# Patient Record
Sex: Female | Born: 1960 | Race: White | Hispanic: No | Smoking: Never smoker
Health system: Southern US, Community
[De-identification: ages and names within clinical notes are randomized; demographics above are authoritative.]

## PROBLEM LIST (undated history)

## (undated) DIAGNOSIS — F32A Depression, unspecified: Secondary | ICD-10-CM

## (undated) DIAGNOSIS — I10 Essential (primary) hypertension: Secondary | ICD-10-CM

---

## 2019-12-25 DIAGNOSIS — G935 Compression of brain: Secondary | ICD-10-CM | POA: Diagnosis not present

## 2019-12-25 DIAGNOSIS — I10 Essential (primary) hypertension: Secondary | ICD-10-CM | POA: Diagnosis not present

## 2019-12-25 DIAGNOSIS — R202 Paresthesia of skin: Secondary | ICD-10-CM | POA: Diagnosis present

## 2019-12-25 DIAGNOSIS — N390 Urinary tract infection, site not specified: Secondary | ICD-10-CM | POA: Insufficient documentation

## 2019-12-25 DIAGNOSIS — H532 Diplopia: Secondary | ICD-10-CM | POA: Insufficient documentation

## 2019-12-25 LAB — COMPREHENSIVE METABOLIC PANEL
ALT: 18 U/L (ref 0–44)
AST: 18 U/L (ref 15–41)
Albumin: 4.2 g/dL (ref 3.5–5.0)
Alkaline Phosphatase: 69 U/L (ref 38–126)
Anion gap: 9 (ref 5–15)
BUN: 18 mg/dL (ref 6–20)
CO2: 24 mmol/L (ref 22–32)
Calcium: 9 mg/dL (ref 8.9–10.3)
Chloride: 104 mmol/L (ref 98–111)
Creatinine, Ser: 0.81 mg/dL (ref 0.44–1.00)
GFR, Estimated: >60 mL/min (ref >60)
Glucose, Bld: 109 mg/dL — ABNORMAL HIGH (ref 70–99)
Potassium: 4 mmol/L (ref 3.5–5.1)
Sodium: 137 mmol/L (ref 135–145)
Total Bilirubin: 0.6 mg/dL (ref 0.3–1.2)
Total Protein: 7.5 g/dL (ref 6.5–8.1)

## 2019-12-25 LAB — CBC
HCT: 42.8 % (ref 36.0–46.0)
Hemoglobin: 13.7 g/dL (ref 12.0–15.0)
MCH: 27.1 pg (ref 26.0–34.0)
MCHC: 32 g/dL (ref 30.0–36.0)
MCV: 84.6 fL (ref 80.0–100.0)
Platelets: 277 10*3/uL (ref 150–400)
RBC: 5.06 MIL/uL (ref 3.87–5.11)
RDW: 14.1 % (ref 11.5–15.5)
WBC: 13.3 10*3/uL — ABNORMAL HIGH (ref 4.0–10.5)
nRBC: 0 % (ref 0.0–0.2)

## 2019-12-25 LAB — TROPONIN I (HIGH SENSITIVITY)
Troponin I (High Sensitivity): 4 ng/L (ref <18)
Troponin I (High Sensitivity): 5 ng/L (ref <18)

## 2019-12-25 NOTE — ED Triage Notes (Signed)
PT has had numbness to left arm since march, states was told due to htn. Has seen cardio, neuro for the same and recently has had dizziness with symptoms. States has also had double vision intermittently since last week which is a new symptom.

## 2019-12-26 ENCOUNTER — Emergency Department: Payer: BLUE CROSS/BLUE SHIELD

## 2019-12-26 ENCOUNTER — Emergency Department
Admission: EM | Admit: 2019-12-26 | Discharge: 2019-12-26 | Disposition: A | Discharge status: Home / Self Care | Payer: BLUE CROSS/BLUE SHIELD | Attending: Emergency Medicine | Admitting: Emergency Medicine

## 2019-12-26 DIAGNOSIS — R200 Anesthesia of skin: Secondary | ICD-10-CM

## 2019-12-26 DIAGNOSIS — G935 Compression of brain: Secondary | ICD-10-CM

## 2019-12-26 DIAGNOSIS — H532 Diplopia: Secondary | ICD-10-CM

## 2019-12-26 DIAGNOSIS — N390 Urinary tract infection, site not specified: Secondary | ICD-10-CM

## 2019-12-26 LAB — URINALYSIS, COMPLETE (UACMP) WITH MICROSCOPIC
Bilirubin Urine: NEGATIVE
Glucose, UA: NEGATIVE mg/dL
Hgb urine dipstick: NEGATIVE
Ketones, ur: NEGATIVE mg/dL
Nitrite: NEGATIVE
Protein, ur: NEGATIVE mg/dL
Specific Gravity, Urine: 1.021 (ref 1.005–1.030)
WBC, UA: >50 WBC/hpf — ABNORMAL HIGH (ref 0–5)
pH: 5 (ref 5.0–8.0)

## 2019-12-26 LAB — SEDIMENTATION RATE: Sed Rate: 11 mm/hr (ref 0–30)

## 2019-12-26 IMAGING — MR MR MRA HEAD W/O CM
1 series · 18 of 48 positions shown · non-contrast
Comparison: None available.

CLINICAL DATA: Initial evaluation for acute dizziness. Several
month history of left arm numbness.

EXAM:
MRI HEAD WITHOUT CONTRAST
MRA HEAD WITHOUT CONTRAST
TECHNIQUE: Multiplanar, multiecho pulse sequences of the brain and surrounding
structures were obtained without intravenous contrast. Angiographic
images of the head were obtained using MRA technique without
contrast.

[Series 1: TOF · axial · 0.5mm · 0.41mm/px · z∈[-107,-10]mm · 18 of 205 slices shown]
[im 1/205]
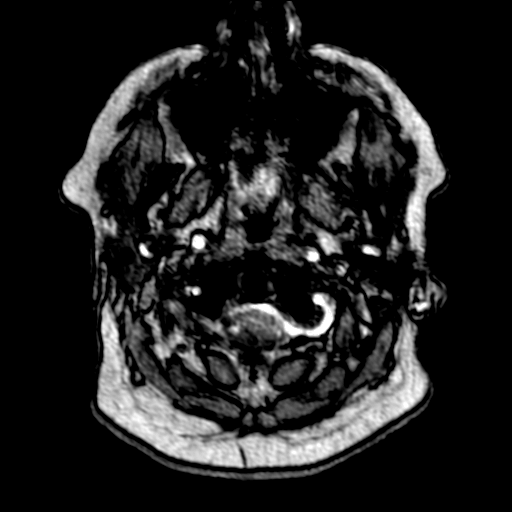
[im 5/205]
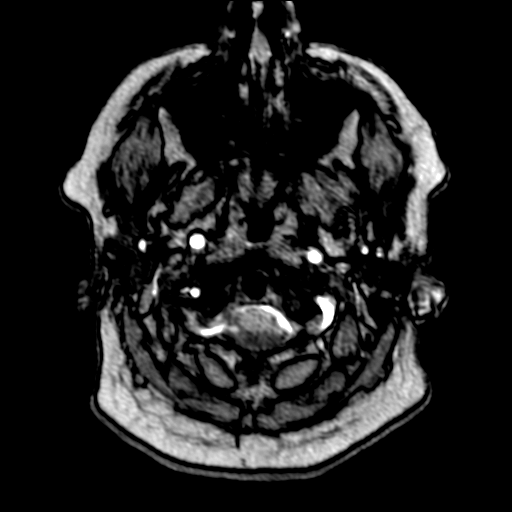
[im 9/205]
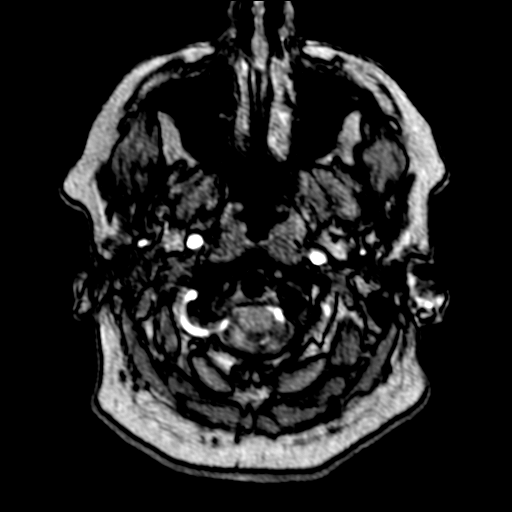
[im 14/205]
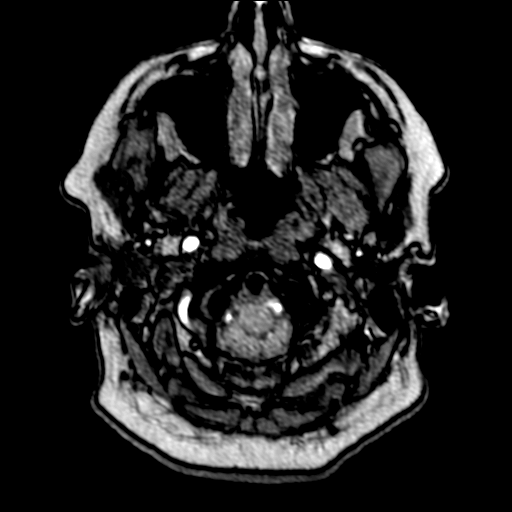
[im 18/205]
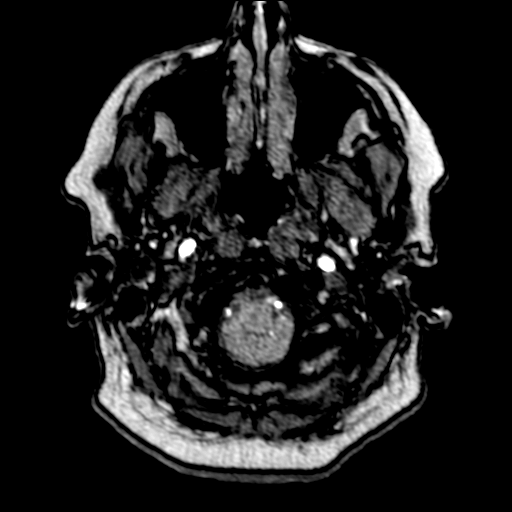
[im 22/205]
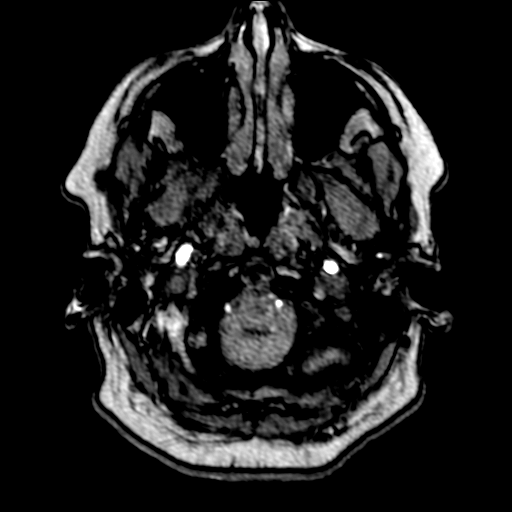
[im 27/205]
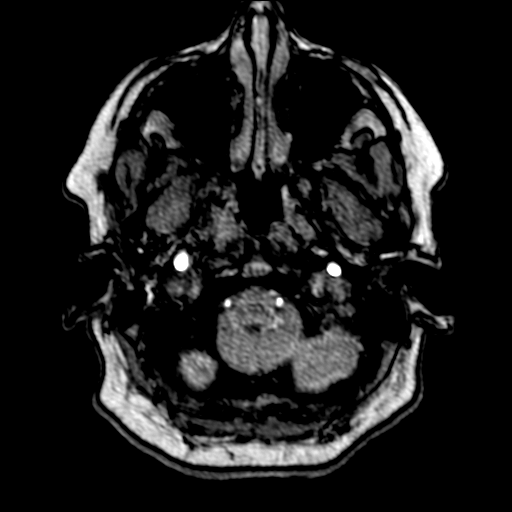
[im 31/205]
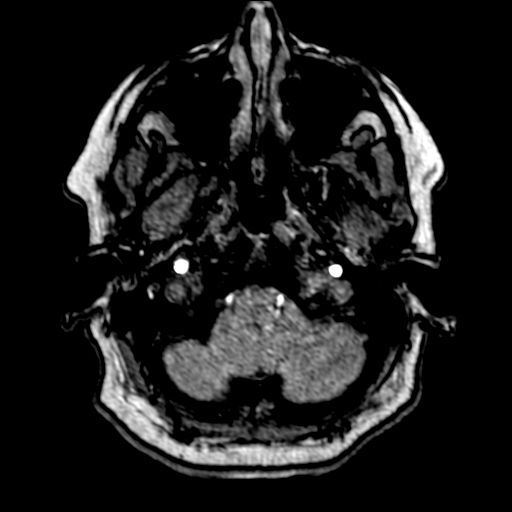
[im 35/205]
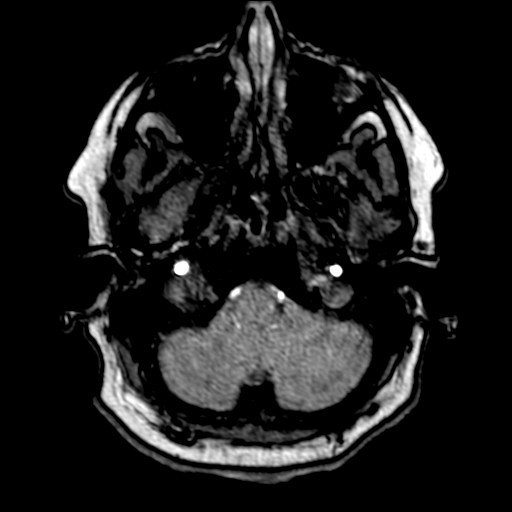
[im 40/205]
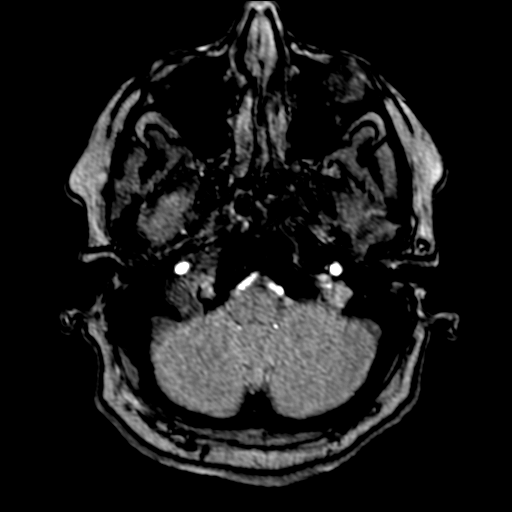
[im 66/205]
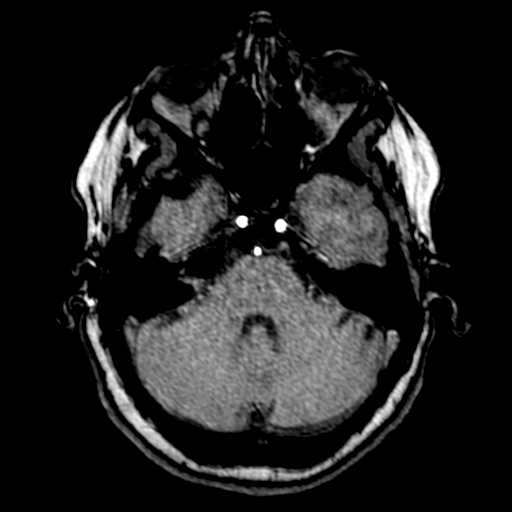
[im 92/205]
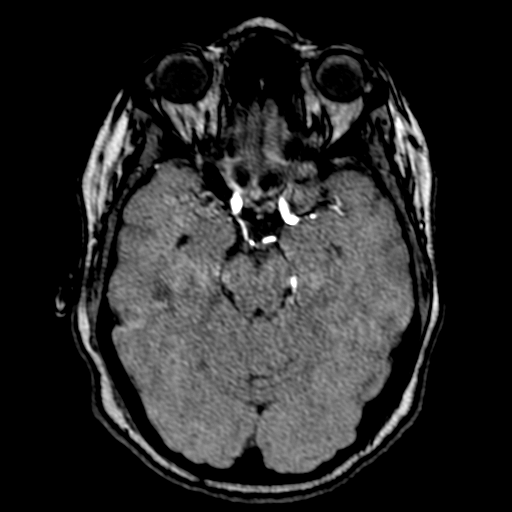
[im 105/205]
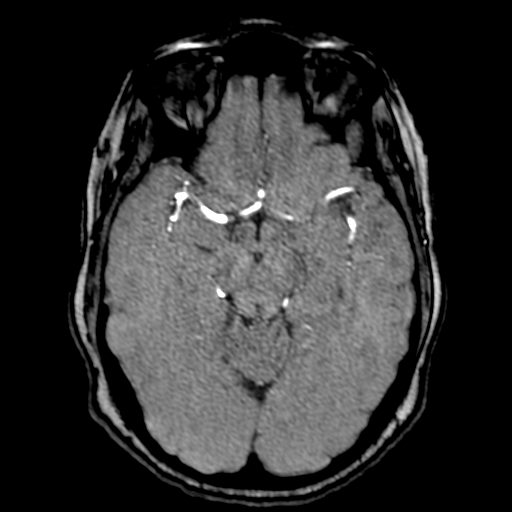
[im 118/205]
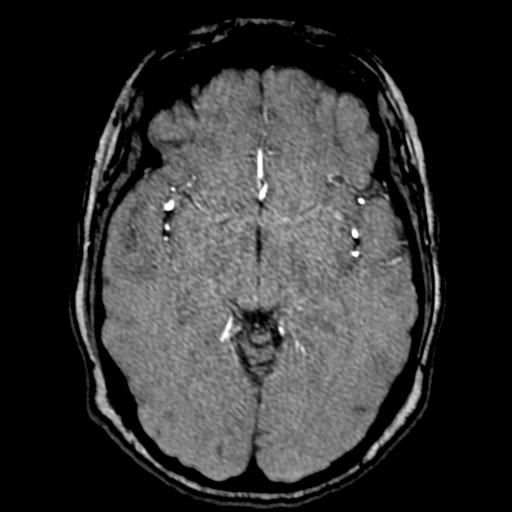
[im 144/205]
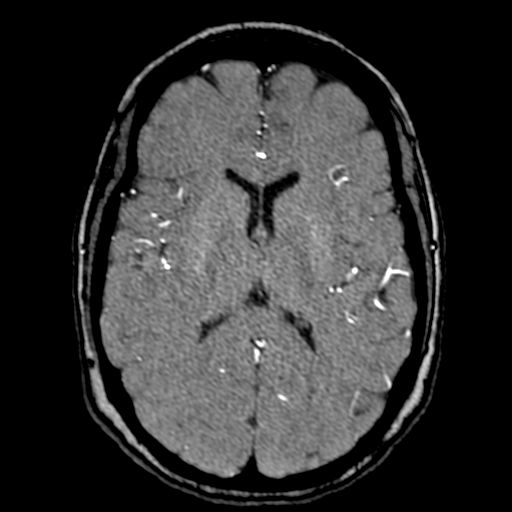
[im 170/205]
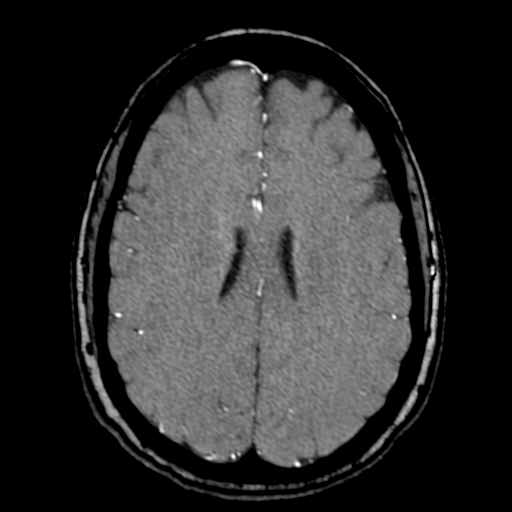
[im 174/205]
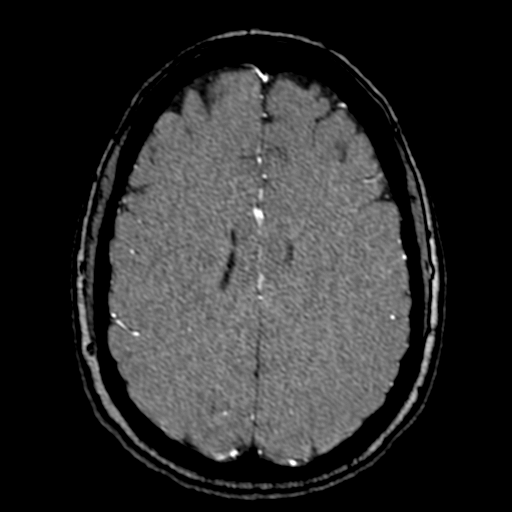
[im 196/205]
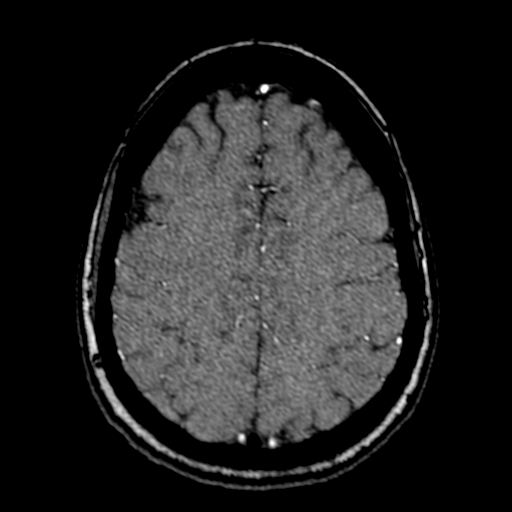

[18 of 48 positions shown; findings below may reference images not displayed]

FINDINGS: MRI HEAD FINDINGS

Brain: Cerebral volume within normal limits for age. Few scattered
subcentimeter foci of T2/FLAIR hyperintensity noted within the deep
white matter both cerebral hemispheres, most prominent of which
measures 7 mm at the left frontal corona radiata (series 15, image
32). Findings are nonspecific, but most likely small vessel related,
felt to be within normal limits for age. No other focal parenchymal
signal abnormality.

No abnormal foci of restricted diffusion to suggest acute or
subacute ischemia. Gray-white matter differentiation maintained. No
encephalomalacia to suggest chronic cortical infarction. No acute
intracranial hemorrhage. Two small foci of susceptibility artifact
noted at the posterior left parieto-occipital white matter (series
3, image 33), suggesting small chronic micro hemorrhages, of
doubtful significance in isolation.

No mass lesion, midline shift or mass effect. No hydrocephalus or
extra-axial fluid collection. Pituitary gland and suprasellar region
within normal limits. Midline structures intact.

Vascular: Major intracranial vascular flow voids are maintained.

Skull and upper cervical spine: Borderline Chiari 1 malformation
with the cerebellar tonsils extending approximately 6-7 mm through
the foramen magnum. Craniocervical junction otherwise unremarkable.
Bone marrow signal intensity within normal limits. No scalp soft
tissue abnormality.

Sinuses/Orbits: Globes and orbital soft tissues within normal
limits. Paranasal sinuses are largely clear. No mastoid effusion.
Inner ear structures grossly normal.

Other: None.

MRA HEAD FINDINGS

ANTERIOR CIRCULATION:

Examination mildly degraded by motion.

Visualized distal cervical segments of the internal carotid arteries
are patent with symmetric antegrade flow. Petrous, cavernous, and
supraclinoid ICAs patent without significant stenosis or other
abnormality. A1 segments widely patent. Normal anterior
communicating artery complex. Anterior cerebral arteries patent to
their distal aspects without stenosis. No M1 stenosis or occlusion.
Normal MCA bifurcations. Distal MCA branches well perfused and
symmetric.

POSTERIOR CIRCULATION:

Both vertebral arteries patent to the vertebrobasilar junction
without stenosis. Both PICAs grossly patent. Basilar patent to its
distal aspect without stenosis. Superior cerebral arteries patent
bilaterally. PCA supplied via the basilar as well as small bilateral
posterior communicating arteries. Both PCAs well perfused to their
distal aspects without stenosis.

No intracranial aneurysm or other vascular abnormality.
IMPRESSION: MRI HEAD IMPRESSION:

1. No acute intracranial abnormality.
2. Borderline Chiari 1 malformation with the cerebellar tonsils
extending approximately 6-7 mm through the foramen magnum.
3. Otherwise essentially normal brain MRI for age.

MRA HEAD IMPRESSION:

Negative intracranial MRA. No large vessel occlusion,
hemodynamically significant stenosis, or other vascular abnormality.

## 2019-12-26 IMAGING — MR MR HEAD W/O CM
10 of 11 series · 37 of 48 positions shown · non-contrast
Comparison: None available.

CLINICAL DATA: Initial evaluation for acute dizziness. Several
month history of left arm numbness.

EXAM:
MRI HEAD WITHOUT CONTRAST
MRA HEAD WITHOUT CONTRAST
TECHNIQUE: Multiplanar, multiecho pulse sequences of the brain and surrounding
structures were obtained without intravenous contrast. Angiographic
images of the head were obtained using MRA technique without
contrast.

[Series 5: ax dwi_tracew · axial · 3.0mm · 0.60mm/px · z∈[-118,+37]mm · 4 of 48 slices shown]
[im 1/48]
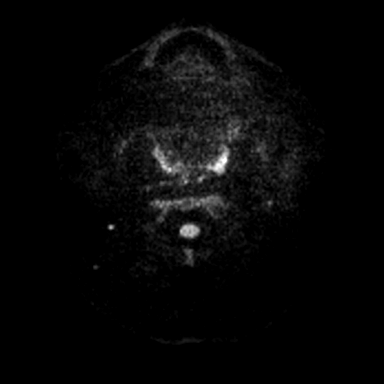
[im 16/48]
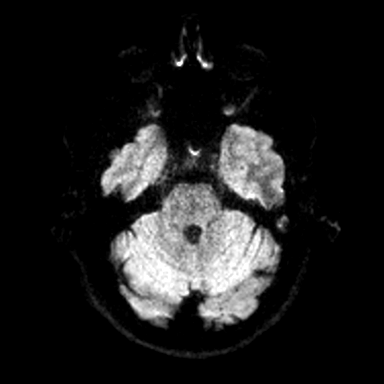
[im 32/48]
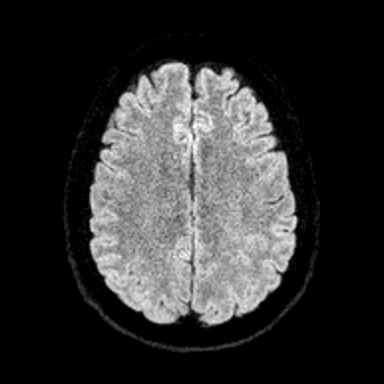
[im 48/48]
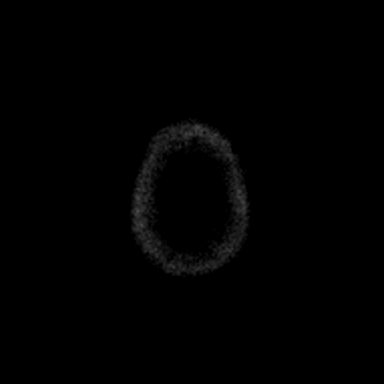

[Series 6: ax dwi_adc · axial · 3.0mm · 0.60mm/px · z∈[-118,+37]mm · 4 of 48 slices shown]
[im 1/48]
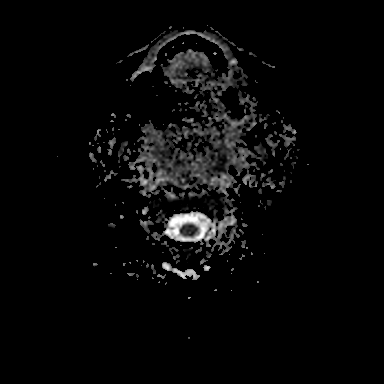
[im 16/48]
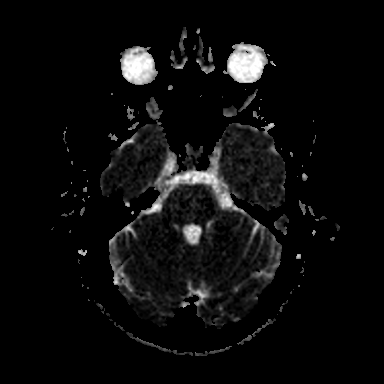
[im 32/48]
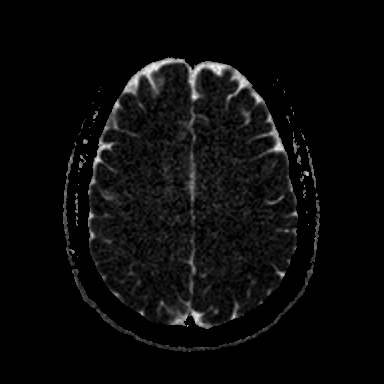
[im 48/48]
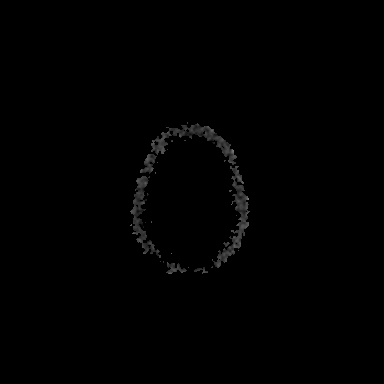

[Series 7: cor dwi_tracew · coronal · 5.0mm · 0.60mm/px · 3 of 40 slices shown]
[im 1/40]
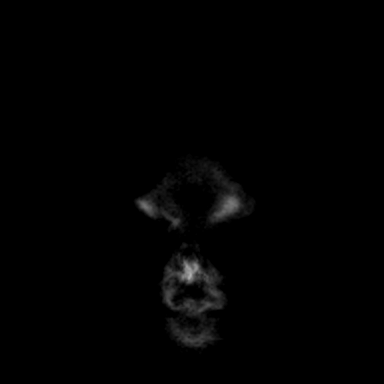
[im 20/40]
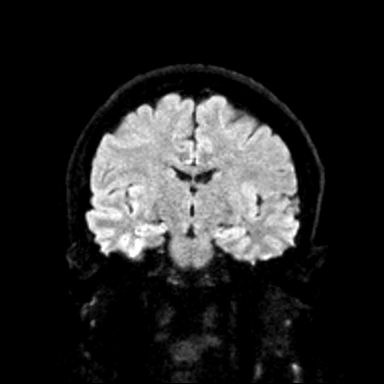
[im 40/40]
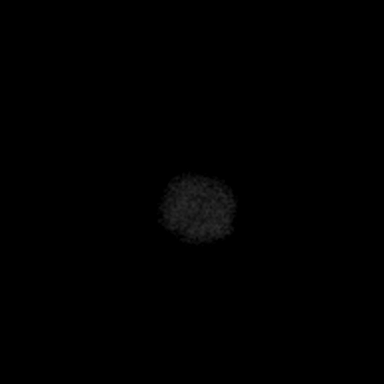

[Series 8: cor dwi_adc · coronal · 5.0mm · 0.60mm/px · 3 of 39 slices shown]
[im 1/39]
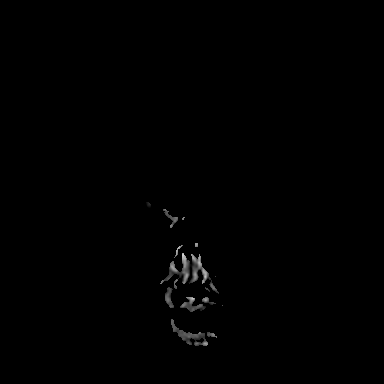
[im 20/39]
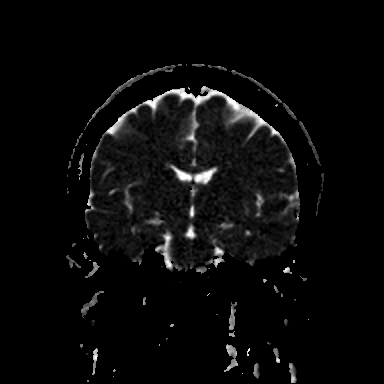
[im 39/39]
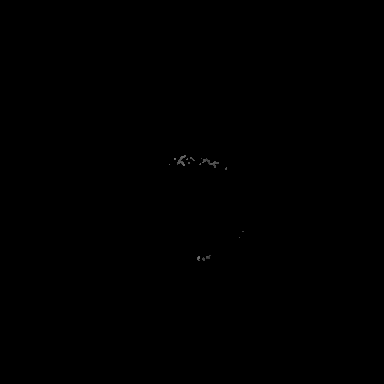

[Series 9: T1 · sagittal · 5.0mm · 0.62mm/px · 2 of 23 slices shown (1 of 2)]
[im 1/23]
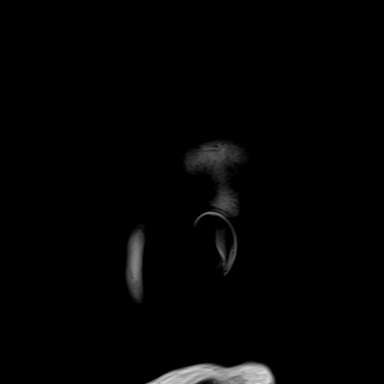
[im 23/23]
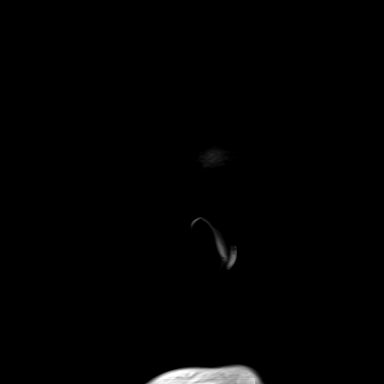

[Series 10: T2 · axial · 5.0mm · 0.53mm/px · z∈[-118,+38]mm · 2 of 27 slices shown (1 of 2)]
[im 1/27]
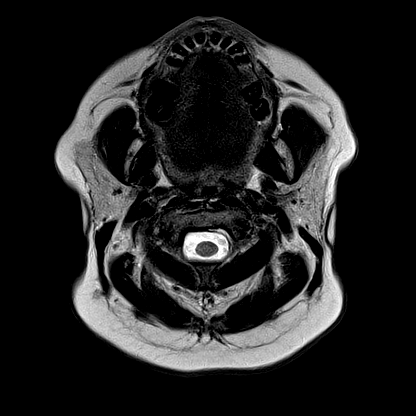
[im 27/27]
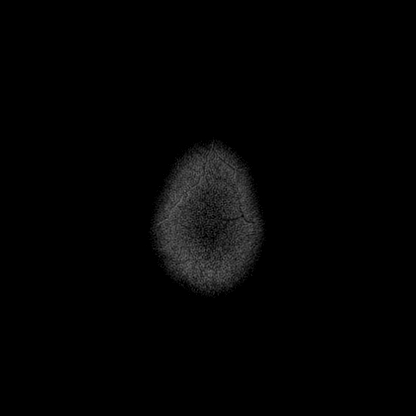

[Series 12: pha_images · axial · 3.0mm · 0.90mm/px · z∈[-126,+45]mm · 5 of 58 slices shown]
[im 1/58]
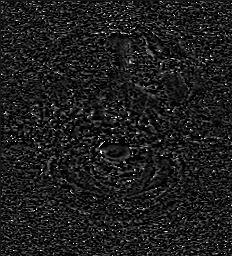
[im 15/58]
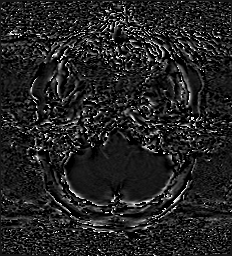
[im 29/58]
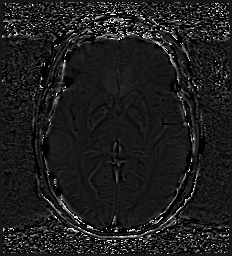
[im 43/58]
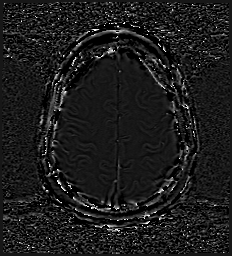
[im 58/58]
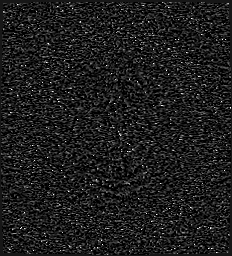

[Series 15: FLAIR · axial · 3.0mm · 0.53mm/px · z∈[-121,+41]mm · 4 of 55 slices shown]
[im 1/55]
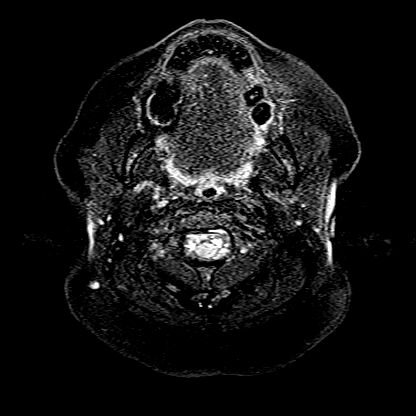
[im 19/55]
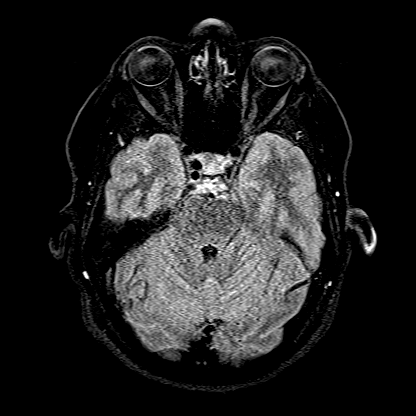
[im 37/55]
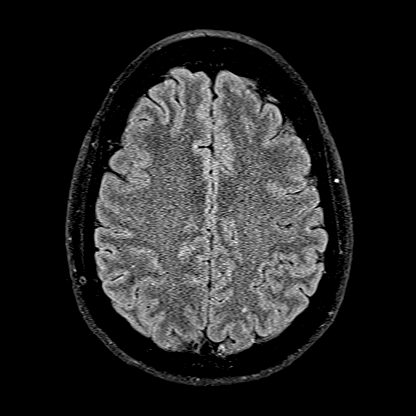
[im 55/55]
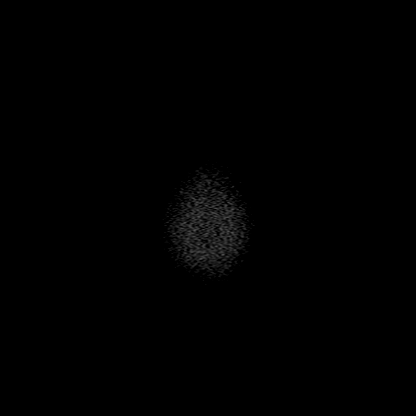

[Series 16: T1 · axial · 1.0mm · 0.98mm/px · z∈[-128,+47]mm · 8 of 176 slices shown (2 of 2)]
[im 1/176]
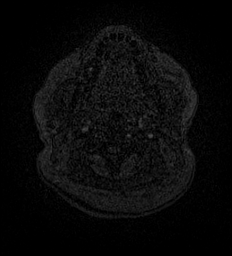
[im 27/176]
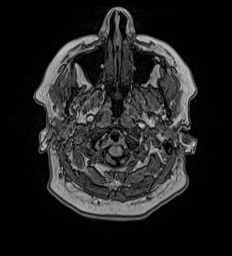
[im 54/176]
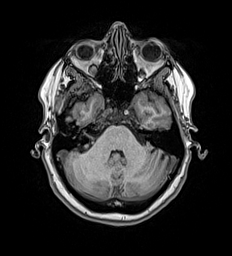
[im 81/176]
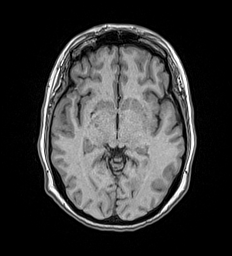
[im 95/176]
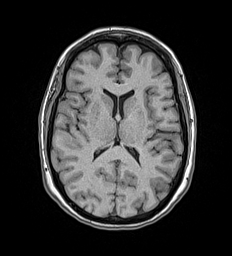
[im 122/176]
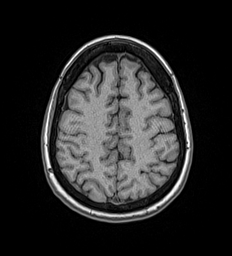
[im 149/176]
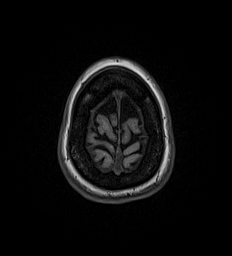
[im 176/176]
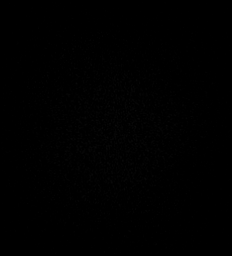

[Series 17: T2 · coronal · 5.0mm · 0.57mm/px · 2 of 29 slices shown (2 of 2)]
[im 1/29]
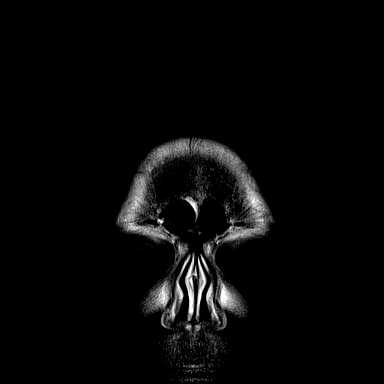
[im 29/29]
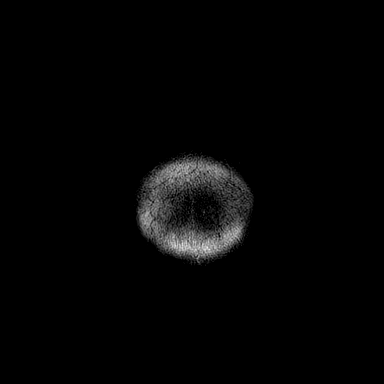

[37 of 48 positions shown; findings below may reference images not displayed]

FINDINGS: MRI HEAD FINDINGS

Brain: Cerebral volume within normal limits for age. Few scattered
subcentimeter foci of T2/FLAIR hyperintensity noted within the deep
white matter both cerebral hemispheres, most prominent of which
measures 7 mm at the left frontal corona radiata (series 15, image
32). Findings are nonspecific, but most likely small vessel related,
felt to be within normal limits for age. No other focal parenchymal
signal abnormality.

No abnormal foci of restricted diffusion to suggest acute or
subacute ischemia. Gray-white matter differentiation maintained. No
encephalomalacia to suggest chronic cortical infarction. No acute
intracranial hemorrhage. Two small foci of susceptibility artifact
noted at the posterior left parieto-occipital white matter (series
3, image 33), suggesting small chronic micro hemorrhages, of
doubtful significance in isolation.

No mass lesion, midline shift or mass effect. No hydrocephalus or
extra-axial fluid collection. Pituitary gland and suprasellar region
within normal limits. Midline structures intact.

Vascular: Major intracranial vascular flow voids are maintained.

Skull and upper cervical spine: Borderline Chiari 1 malformation
with the cerebellar tonsils extending approximately 6-7 mm through
the foramen magnum. Craniocervical junction otherwise unremarkable.
Bone marrow signal intensity within normal limits. No scalp soft
tissue abnormality.

Sinuses/Orbits: Globes and orbital soft tissues within normal
limits. Paranasal sinuses are largely clear. No mastoid effusion.
Inner ear structures grossly normal.

Other: None.

MRA HEAD FINDINGS

ANTERIOR CIRCULATION:

Examination mildly degraded by motion.

Visualized distal cervical segments of the internal carotid arteries
are patent with symmetric antegrade flow. Petrous, cavernous, and
supraclinoid ICAs patent without significant stenosis or other
abnormality. A1 segments widely patent. Normal anterior
communicating artery complex. Anterior cerebral arteries patent to
their distal aspects without stenosis. No M1 stenosis or occlusion.
Normal MCA bifurcations. Distal MCA branches well perfused and
symmetric.

POSTERIOR CIRCULATION:

Both vertebral arteries patent to the vertebrobasilar junction
without stenosis. Both PICAs grossly patent. Basilar patent to its
distal aspect without stenosis. Superior cerebral arteries patent
bilaterally. PCA supplied via the basilar as well as small bilateral
posterior communicating arteries. Both PCAs well perfused to their
distal aspects without stenosis.

No intracranial aneurysm or other vascular abnormality.
IMPRESSION: MRI HEAD IMPRESSION:

1. No acute intracranial abnormality.
2. Borderline Chiari 1 malformation with the cerebellar tonsils
extending approximately 6-7 mm through the foramen magnum.
3. Otherwise essentially normal brain MRI for age.

MRA HEAD IMPRESSION:

Negative intracranial MRA. No large vessel occlusion,
hemodynamically significant stenosis, or other vascular abnormality.

## 2019-12-26 MED ORDER — CEPHALEXIN 500 MG PO CAPS
500.0000 mg | ORAL_CAPSULE | Freq: Once | ORAL | Status: AC
  Administered 2019-12-26: 500 mg via ORAL
  Filled 2019-12-26: qty 1

## 2019-12-26 MED ORDER — LORAZEPAM 2 MG/ML IJ SOLN
2.0000 mg | Freq: Once | INTRAMUSCULAR | Status: AC
  Administered 2019-12-26: 2 mg via INTRAMUSCULAR
  Filled 2019-12-26: qty 1

## 2019-12-26 MED ORDER — CEPHALEXIN 500 MG PO CAPS: 500.0000 mg | ORAL_CAPSULE | Freq: Three times a day (TID) | ORAL | 0 refills | Status: DC

## 2019-12-26 NOTE — ED Notes (Signed)
Visual Acuity Screening   BIL eyes 20/20 Left eye 20/30 Right eye 20/25

## 2019-12-26 NOTE — Discharge Instructions (Addendum)
Continue your medicines as directed by your doctor. Take antibiotic as prescribed for UTI. Please follow-up with the ophthalmologist (eye doctor) and neurologist as soon as possible. Return to the ER for worsening symptoms, persistent vomiting, difficulty breathing or other concerns.

## 2019-12-26 NOTE — ED Provider Notes (Signed)
[facility] Emergency Department Provider Note   ____________________________________________   First MD Initiated Contact with Patient 12/26/19 0134     (approximate)  I have reviewed the triage vital signs and the nursing notes.   HISTORY  Chief Complaint Numbness    HPI Samantha Phillips is a 59 y.o. female who presents to the ED from home with multiple medical complaints.  Patient recently moved from [place] where in March 2021 she started experiencing intermittent numbness to her left arm, then bilateral arms.  She was seen by cardiology who evaluated her with Holter monitoring, cardiac cath, CT chest.  Thought her symptoms were related to hypertension and started on Coreg 12.5 mg twice daily and atorvastatin.  She was seen at several different hospitals with CT imaging with and without contrast of her head and neck.  She also had MRI with and without contrast of her brain.  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  Throughout the course of her extensive work-up, patient was never referred to neurology.  States her symptoms have been better since the summer but since 11/28/2019 she has started to experience intermittent numbness again to mainly her left arm but also her right arm.  These episodes last approximately to 30 minutes and her blood pressures taken throughout these episodes are generally 170s/90s.  She had one episode last week as she was looking on the computer monitor she thought she saw double vision on one specific part of the monitor but nowhere else.  This was improved after she applied eyedrops.  During these episodes patient denies confusion, slurred speech, facial droop, extremity weakness.  She has established a new PCP in this area and has been referred to neurology, currently awaiting appointment.  Denies recent fever, cough, chest pain, shortness of breath, abdominal  pain, nausea or vomiting.  Denies recent travel or trauma.   Past medical history Hypertension  There are no problems to display for this patient.   Prior to Admission medications   Medication Sig Start Date End Date Taking? Authorizing Provider  cephALEXin (KEFLEX) 500 MG capsule Take 1 capsule (500 mg total) by mouth 3 (three) times daily. 12/26/19   Irean Hong, MD    Allergies Sulfa antibiotics and Penicillins  No family history on file.  Social History Social History   Tobacco Use  . Smoking status: Not on file  Substance Use Topics  . Alcohol use: Not on file  . Drug use: Not on file  Non-smoker  Review of Systems  Constitutional: No fever/chills Eyes: No visual changes. ENT: No sore throat. Cardiovascular: Denies chest pain. Respiratory: Denies shortness of breath. Gastrointestinal: No abdominal pain.  No nausea, no vomiting.  No diarrhea.  No constipation. Genitourinary: Negative for dysuria. Musculoskeletal: Negative for back pain. Skin: Negative for rash. Neurological: Negative for headaches, focal weakness.  Positive for numbness.   ____________________________________________   PHYSICAL EXAM:  VITAL SIGNS: ED Triage Vitals  Enc Vitals Group     BP 12/25/19 1948 (!) 149/85     Pulse Rate 12/25/19 1948 64     Resp 12/25/19 1948 18     Temp 12/25/19 1948 98.7 F (37.1 C)     Temp Source 12/25/19 1948 Oral     SpO2 12/25/19 1948 97 %     Weight 12/25/19 1948 257 lb (116.6 kg)     Height 12/25/19 1948 5\' 3"  (1.6 m)  Head Circumference --      Peak Flow --      Pain Score 12/25/19 1952 0     Pain Loc --      Pain Edu? --      Excl. in GC? --     Constitutional: Alert and oriented. Well appearing and in no acute distress. Eyes: Conjunctivae are normal. PERRL. EOMI. Head: Atraumatic. Nose: No congestion/rhinnorhea. Mouth/Throat: Mucous membranes are moist.   Neck: No stridor.  Supple neck without meningismus.  No carotid  bruits. Cardiovascular: Normal rate, regular rhythm. Grossly normal heart sounds.  Good peripheral circulation. Respiratory: Normal respiratory effort.  No retractions. Lungs CTAB. Gastrointestinal: Soft and nontender. No distention. No abdominal bruits. No CVA tenderness. Musculoskeletal: No lower extremity tenderness nor edema.  No joint effusions. Neurologic: Alert and oriented x3.  CN II-XII grossly intact.  Normal speech and language. No gross focal neurologic deficits are appreciated. No gait instability. Skin:  Skin is warm, dry and intact. No rash noted.  No petechiae. Psychiatric: Mood and affect are normal. Speech and behavior are normal.  ____________________________________________   LABS (all labs ordered are listed, but only abnormal results are displayed)  Labs Reviewed  CBC - Abnormal; Notable for the following components:      Result Value   WBC 13.3 (*)    All other components within normal limits  COMPREHENSIVE METABOLIC PANEL - Abnormal; Notable for the following components:   Glucose, Bld 109 (*)    All other components within normal limits  URINALYSIS, COMPLETE (UACMP) WITH MICROSCOPIC - Abnormal; Notable for the following components:   Color, Urine YELLOW (*)    APPearance CLOUDY (*)    Leukocytes,Ua LARGE (*)    WBC, UA >50 (*)    Bacteria, UA RARE (*)    All other components within normal limits  URINE CULTURE  SEDIMENTATION RATE  TROPONIN I (HIGH SENSITIVITY)  TROPONIN I (HIGH SENSITIVITY)   ____________________________________________  EKG  ED ECG REPORT I, Brit Wernette J, the attending physician, personally viewed and interpreted this ECG.   Date: 12/26/2019  EKG Time: 1949  Rate: 67  Rhythm: normal EKG, normal sinus rhythm  Axis: Normal  Intervals:none  ST&T Change: Nonspecific  ____________________________________________  RADIOLOGY I, Dailin Sosnowski J, personally viewed and evaluated these images (plain radiographs) as part of my medical  decision making, as well as reviewing the written report by the radiologist.  ED MD interpretation: No acute intracranial abnormality, Chiari I malformation, negative MRA  Official radiology report(s): MR ANGIO HEAD WO CONTRAST  Result Date: 12/26/2019 CLINICAL DATA:  Initial evaluation for acute dizziness. Several month history of left arm numbness. EXAM: MRI HEAD WITHOUT CONTRAST MRA HEAD WITHOUT CONTRAST TECHNIQUE: Multiplanar, multiecho pulse sequences of the brain and surrounding structures were obtained without intravenous contrast. Angiographic images of the head were obtained using MRA technique without contrast. COMPARISON:  None available. FINDINGS: MRI HEAD FINDINGS Brain: Cerebral volume within normal limits for age. Few scattered subcentimeter foci of T2/FLAIR hyperintensity noted within the deep white matter both cerebral hemispheres, most prominent of which measures 7 mm at the left frontal corona radiata (series 15, image 32). Findings are nonspecific, but most likely small vessel related, felt to be within normal limits for age. No other focal parenchymal signal abnormality. No abnormal foci of restricted diffusion to suggest acute or subacute ischemia. Gray-white matter differentiation maintained. No encephalomalacia to suggest chronic cortical infarction. No acute intracranial hemorrhage. Two small foci of susceptibility artifact noted at the posterior left  parieto-occipital white matter (series 3, image 33), suggesting small chronic micro hemorrhages, of doubtful significance in isolation. No mass lesion, midline shift or mass effect. No hydrocephalus or extra-axial fluid collection. Pituitary gland and suprasellar region within normal limits. Midline structures intact. Vascular: Major intracranial vascular flow voids are maintained. 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  Craniocervical junction otherwise unremarkable. Bone marrow signal intensity within normal limits. No scalp soft tissue abnormality. Sinuses/Orbits: Globes and orbital soft tissues within normal limits. Paranasal sinuses are largely clear. No mastoid effusion. Inner ear structures grossly normal. Other: None. MRA HEAD FINDINGS ANTERIOR CIRCULATION: Examination mildly degraded by motion. Visualized distal cervical segments of the internal carotid arteries are patent with symmetric antegrade flow. Petrous, cavernous, and supraclinoid ICAs patent without significant stenosis or other abnormality. A1 segments widely patent. Normal anterior communicating artery complex. Anterior cerebral arteries patent to their distal aspects without stenosis. No M1 stenosis or occlusion. Normal MCA bifurcations. Distal MCA branches well perfused and symmetric. POSTERIOR CIRCULATION: Both vertebral arteries patent to the vertebrobasilar junction without stenosis. Both PICAs grossly patent. Basilar patent to its distal aspect without stenosis. Superior cerebral arteries patent bilaterally. PCA supplied via the basilar as well as small bilateral posterior communicating arteries. Both PCAs well perfused to their distal aspects without stenosis. No intracranial aneurysm or other vascular abnormality. IMPRESSION: MRI HEAD IMPRESSION: 1. No acute intracranial abnormality. 2. 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 3. Otherwise essentially normal brain MRI for age. MRA HEAD IMPRESSION: Negative intracranial MRA. No large vessel occlusion, hemodynamically significant stenosis, or other vascular abnormality. Electronically Signed   By: Rise Mu M.D.   On: 12/26/2019 04:29   MR BRAIN WO CONTRAST  Result Date: 12/26/2019 CLINICAL DATA:  Initial evaluation for acute dizziness. Several month history of left arm numbness. EXAM: MRI HEAD WITHOUT CONTRAST  MRA HEAD WITHOUT CONTRAST TECHNIQUE: Multiplanar, multiecho pulse sequences of the brain and surrounding structures were obtained without intravenous contrast. Angiographic images of the head were obtained using MRA technique without contrast. COMPARISON:  None available. FINDINGS: MRI HEAD FINDINGS Brain: Cerebral volume within normal limits for age. Few scattered subcentimeter foci of T2/FLAIR hyperintensity noted within the deep white matter both cerebral hemispheres, most prominent of which measures 7 mm at the left frontal corona radiata (series 15, image 32). Findings are nonspecific, but most likely small vessel related, felt to be within normal limits for age. No other focal parenchymal signal abnormality. No abnormal foci of restricted diffusion to suggest acute or subacute ischemia. Gray-white matter differentiation maintained. No encephalomalacia to suggest chronic cortical infarction. No acute intracranial hemorrhage. Two small foci of susceptibility artifact noted at the posterior left parieto-occipital white matter (series 3, image 33), suggesting small chronic micro hemorrhages, of doubtful significance in isolation. No mass lesion, midline shift or mass effect. No hydrocephalus or extra-axial fluid collection. Pituitary gland and suprasellar region within normal limits. Midline structures intact. Vascular: Major intracranial vascular flow voids are maintained. 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 Craniocervical junction otherwise unremarkable. Bone marrow signal intensity within normal limits. No scalp soft tissue abnormality. Sinuses/Orbits: Globes and orbital soft tissues within normal limits. Paranasal sinuses are largely clear. No mastoid effusion. Inner ear structures grossly normal. Other: None. MRA HEAD FINDINGS ANTERIOR CIRCULATION: Examination mildly degraded by  motion.  Visualized distal cervical segments of the internal carotid arteries are patent with symmetric antegrade flow. Petrous, cavernous, and supraclinoid ICAs patent without significant stenosis or other abnormality. A1 segments widely patent. Normal anterior communicating artery complex. Anterior cerebral arteries patent to their distal aspects without stenosis. No M1 stenosis or occlusion. Normal MCA bifurcations. Distal MCA branches well perfused and symmetric. POSTERIOR CIRCULATION: Both vertebral arteries patent to the vertebrobasilar junction without stenosis. Both PICAs grossly patent. Basilar patent to its distal aspect without stenosis. Superior cerebral arteries patent bilaterally. PCA supplied via the basilar as well as small bilateral posterior communicating arteries. Both PCAs well perfused to their distal aspects without stenosis. No intracranial aneurysm or other vascular abnormality. IMPRESSION: MRI HEAD IMPRESSION: 1. No acute intracranial abnormality. 2. 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 3. Otherwise essentially normal brain MRI for age. MRA HEAD IMPRESSION: Negative intracranial MRA. No large vessel occlusion, hemodynamically significant stenosis, or other vascular abnormality. Electronically Signed   By: Rise Mu M.D.   On: 12/26/2019 04:29    ____________________________________________   PROCEDURES  Procedure(s) performed (including Critical Care):  Procedures   ____________________________________________   INITIAL IMPRESSION / ASSESSMENT AND PLAN / ED COURSE  As part of my medical decision making, I reviewed the following data within the electronic MEDICAL RECORD NUMBER Nursing notes reviewed and incorporated, Labs reviewed, EKG interpreted, Old chart reviewed, Radiograph reviewed and Notes from prior ED visits     60 year old female with ongoing issues with her mid left arm numbness,  dizziness and vision issues.  Differential diagnosis includes but is not limited to CVA/TIA, ACS, metabolic, infectious, vasculitis, etc.  Laboratory results demonstrate mild leukocytosis.  Patient is afebrile with no focal neurological deficits.  Does not complain of dizziness or numbness currently.  Unfortunately her records from [place] are not readily available in Epic.  The above information is taken from the patient herself.  Given the recurrence and now increased frequency of patient's episodes of dizziness and left arm numbness with possible vision changes, will obtain MRI/MRA brain, add sed rate.   Clinical Course as of Dec 26 607  Tue Dec 26, 2019  6468 Updated patient on negative MRI results other than mild Chiari malformation. Will administer Keflex for UTI, and urine culture. Will provide contact information for multiple specialists. Patient on medical hold as she drove herself and received Ativan for MRI.   [JS]  0441 [facility] noted   [JS]  0606 Patient will be discharged at 8 AM after 4-hour med hold.  She will be discharged on Keflex and has contact information for multiple specialists for follow-up.  Strict return precautions given.  Patient verbalizes understanding and agrees with plan of care.   [JS]    Clinical Course User Index [JS] Irean Hong, MD     ____________________________________________   FINAL CLINICAL IMPRESSION(S) / ED DIAGNOSES  Final diagnoses:  Numbness  Urinary tract infection without hematuria, site unspecified  Chiari malformation type I [facility])  Diplopia     ED Discharge Orders         Ordered    cephALEXin (KEFLEX) 500 MG capsule  3 times daily        12/26/19 0441          *Please note:  Keisa Blow was evaluated in Emergency Department on 12/26/2019 for the symptoms described in the history of present illness. She was evaluated in the context of the global COVID-19  pandemic, which necessitated consideration that the patient might be at  risk for infection with the SARS-CoV-2 virus that causes COVID-19. Institutional protocols and algorithms that pertain to the evaluation of patients at risk for COVID-19 are in a state of rapid change based on information released by regulatory bodies including the CDC and federal and state organizations. These policies and algorithms were followed during the patient's care in the ED.  Some ED evaluations and interventions may be delayed as a result of limited staffing during and the pandemic.*   Note:  This document was prepared using Dragon voice recognition software and may include unintentional dictation errors.   Irean Hong, MD 12/26/19 [phone]

## 2019-12-27 LAB — URINE CULTURE

## 2020-02-08 ENCOUNTER — Other Ambulatory Visit: Payer: Self-pay | Admitting: Unknown Physician Specialty

## 2020-02-08 ENCOUNTER — Other Ambulatory Visit (HOSPITAL_COMMUNITY): Payer: Self-pay | Admitting: Unknown Physician Specialty

## 2020-02-08 DIAGNOSIS — E041 Nontoxic single thyroid nodule: Secondary | ICD-10-CM

## 2020-02-14 ENCOUNTER — Other Ambulatory Visit: Payer: Self-pay

## 2020-02-14 ENCOUNTER — Ambulatory Visit
Admission: RE | Admit: 2020-02-14 | Discharge: 2020-02-14 | Disposition: A | Discharge status: Home / Self Care | Payer: BLUE CROSS/BLUE SHIELD | Source: Ambulatory Visit | Attending: Unknown Physician Specialty | Admitting: Unknown Physician Specialty

## 2020-02-14 DIAGNOSIS — E041 Nontoxic single thyroid nodule: Secondary | ICD-10-CM | POA: Diagnosis present

## 2020-02-14 IMAGING — US US THYROID
1 series · 13 of 25 positions shown · non-contrast
Comparison: None.

CLINICAL DATA: Thyroid nodule follow-up

EXAM:
THYROID ULTRASOUND
TECHNIQUE: Ultrasound examination of the thyroid gland and adjacent soft
tissues was performed.

[Series 1: us thyroid · 0.07mm/px · 13 of 53 slices shown]
[im 1/53]
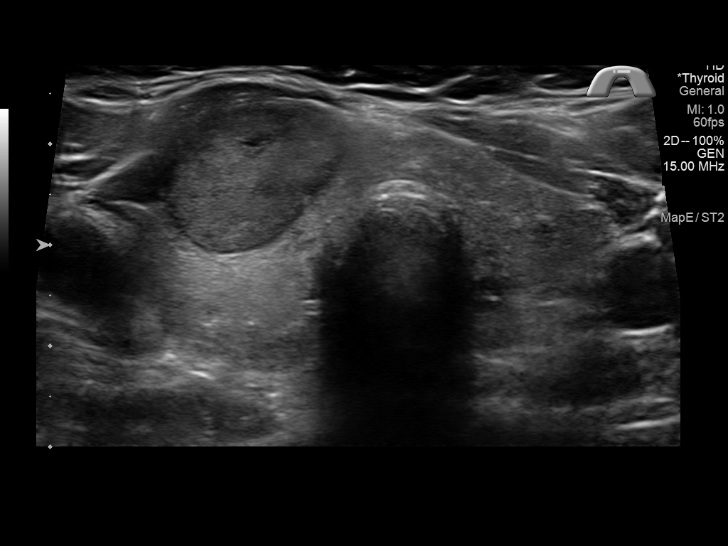
[im 5/53]
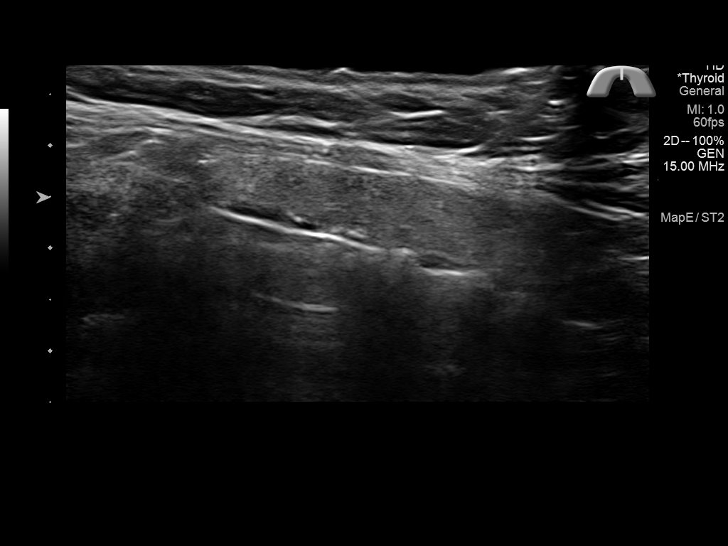
[im 9/53]
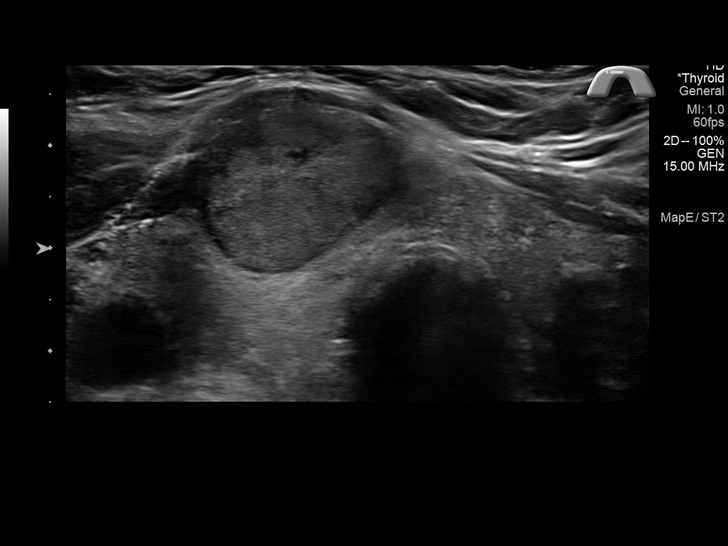
[im 14/53]
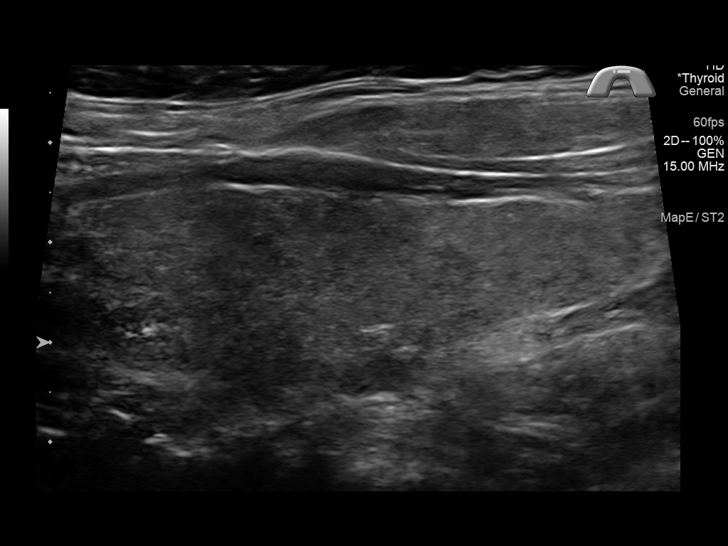
[im 18/53]
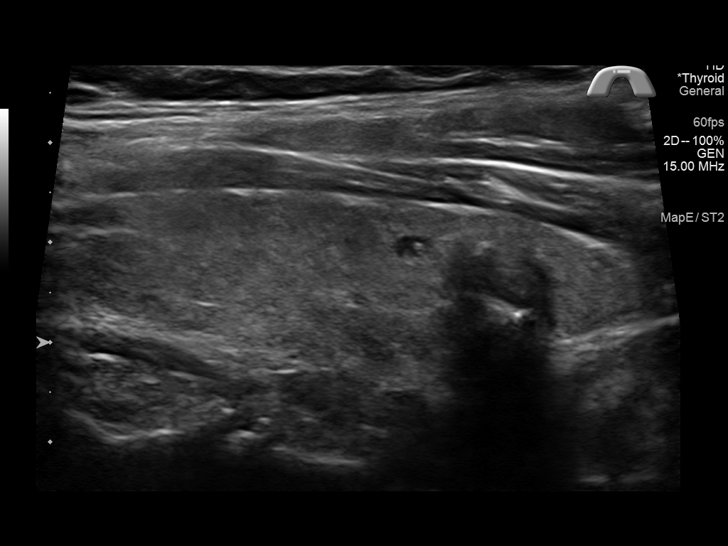
[im 22/53]
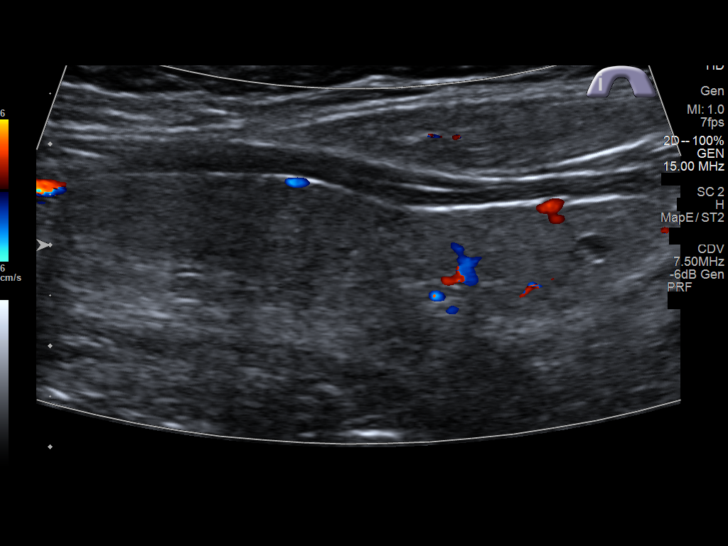
[im 27/53]
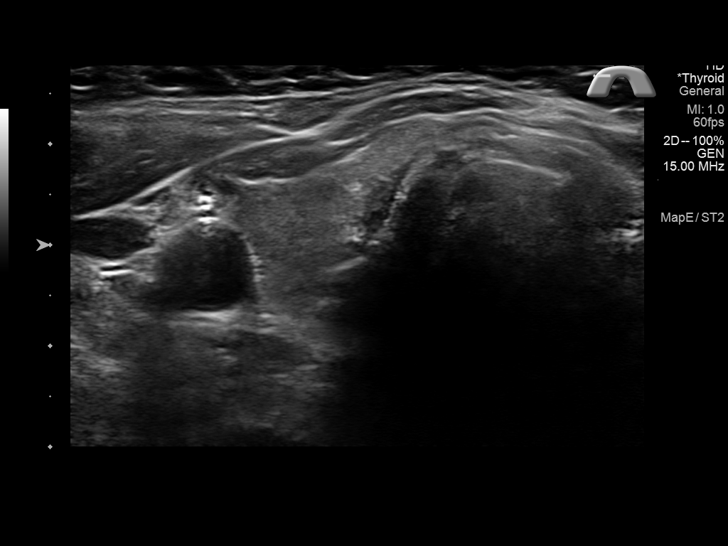
[im 31/53]
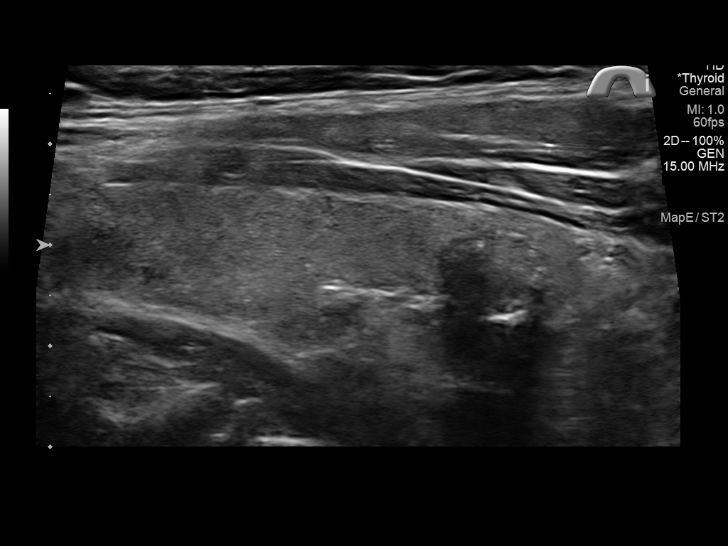
[im 35/53]
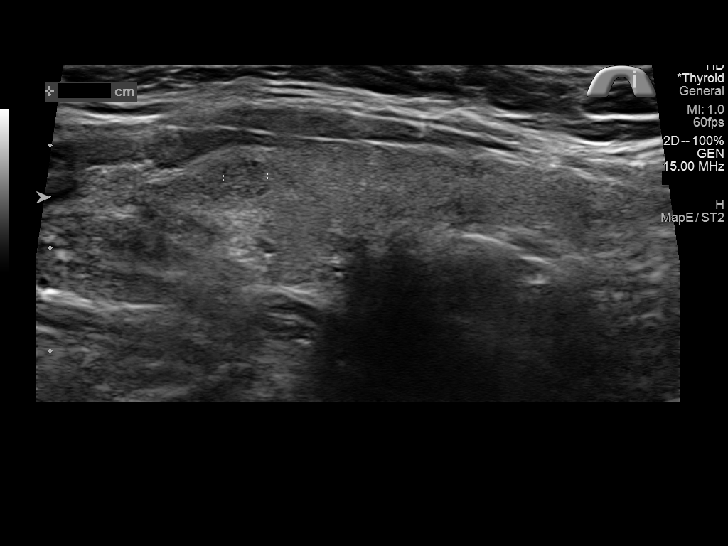
[im 40/53]
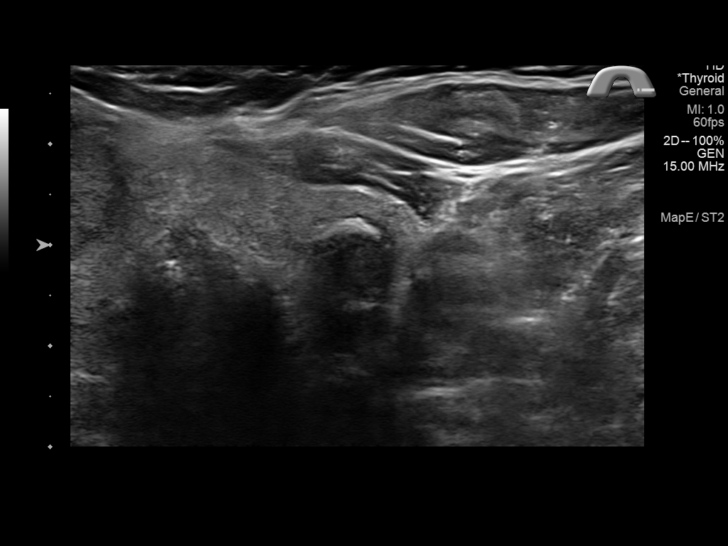
[im 44/53]
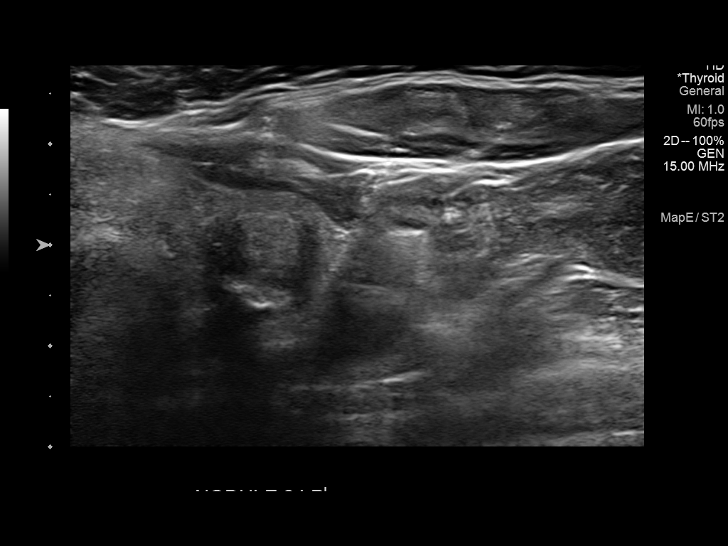
[im 48/53]
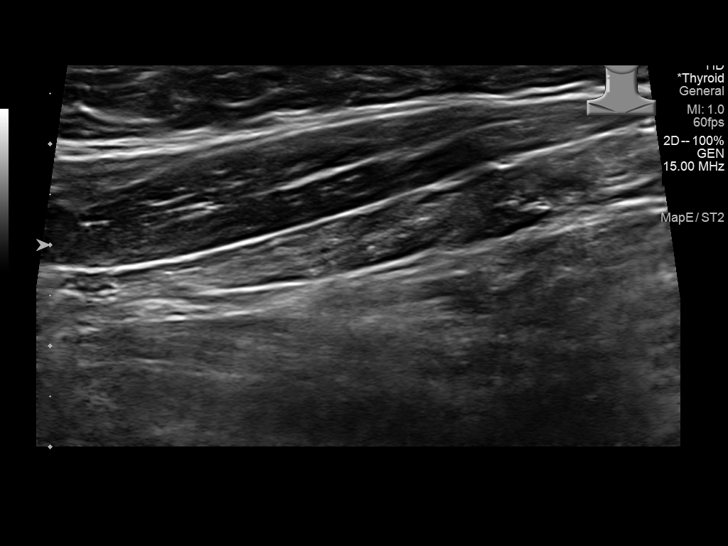
[im 53/53]
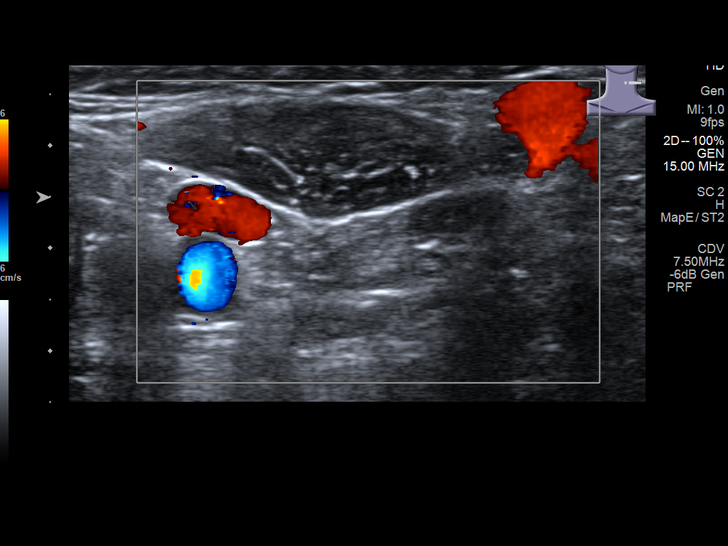

[13 of 25 positions shown; findings below may reference images not displayed]

FINDINGS: Parenchymal Echotexture: Mildly heterogenous

Isthmus: 0.5 cm

Right lobe: 6.1 x 1.8 x 2.2 cm

Left lobe: 5.8 x 1.9 x 1.6 cm

_________________________________________________________

Estimated total number of nodules >/= 1 cm: 2

Number of spongiform nodules >/=  2 cm not described below (TR1): 0

Number of mixed cystic and solid nodules >/= 1.5 cm not described
below (TR2): 0

_________________________________________________________

Nodule # 1:

Location: Right; Superior (this thyroid nodule is nearly within the
isthmus).

Maximum size: 2.3 cm; Other 2 dimensions: 1.9 x 1.6 cm

Composition: solid/almost completely solid (2)

Echogenicity: hypoechoic (2)

Shape: not taller-than-wide (0)

Margins: smooth (0)

Echogenic foci: none (0)

ACR TI-RADS total points: 4.

ACR TI-RADS risk category: TR4 (4-6 points).

ACR TI-RADS recommendations:

**Given size (>/= 1.5 cm) and appearance, fine needle aspiration of
this moderately suspicious nodule should be considered based on
TI-RADS criteria.

_________________________________________________________

Nodule # 2:

Location: Left; Inferior

Maximum size: 1.2 cm; Other 2 dimensions: 1 x 1 cm

Composition: cannot determine (2)

Echogenicity: cannot determine (1)

Shape: not taller-than-wide (0)

Margins: cannot determine (0)

Echogenic foci: peripheral calcifications (2)

ACR TI-RADS total points: 5.

ACR TI-RADS risk category: TR4 (4-6 points).

ACR TI-RADS recommendations:

*Given size (>/= 1 - 1.4 cm) and appearance, a follow-up ultrasound
in 1 year should be considered based on TI-RADS criteria.

_________________________________________________________
IMPRESSION: 1. There is a 2.3 cm TR 4 thyroid nodule in the right superior
thyroid gland/isthmus. This thyroid nodule meets criteria for
fine-needle aspiration. However, the patient reports having
undergone a prior biopsy at outside institution. Correlation with
those biopsy results is recommended. Please confirm that this was
the nodule biopsied.
2. There is a 1.2 cm TR 4 thyroid nodule in the left inferior
thyroid gland. A 1 year ultrasound follow-up is recommended for this
thyroid nodule.

The above is in keeping with the ACR TI-RADS recommendations - [HOSPITAL] [7C];[DATE].

## 2020-02-26 ENCOUNTER — Other Ambulatory Visit (HOSPITAL_COMMUNITY): Payer: Self-pay | Admitting: Unknown Physician Specialty

## 2020-02-26 ENCOUNTER — Other Ambulatory Visit: Payer: Self-pay | Admitting: Unknown Physician Specialty

## 2020-02-26 DIAGNOSIS — E041 Nontoxic single thyroid nodule: Secondary | ICD-10-CM

## 2020-03-04 ENCOUNTER — Ambulatory Visit: Payer: BLUE CROSS/BLUE SHIELD

## 2020-09-18 ENCOUNTER — Other Ambulatory Visit: Payer: Self-pay | Admitting: Family Medicine

## 2020-09-18 DIAGNOSIS — Z1231 Encounter for screening mammogram for malignant neoplasm of breast: Secondary | ICD-10-CM

## 2020-12-07 ENCOUNTER — Emergency Department: Payer: BLUE CROSS/BLUE SHIELD

## 2020-12-07 DIAGNOSIS — F419 Anxiety disorder, unspecified: Secondary | ICD-10-CM | POA: Diagnosis not present

## 2020-12-07 DIAGNOSIS — R410 Disorientation, unspecified: Secondary | ICD-10-CM | POA: Insufficient documentation

## 2020-12-07 DIAGNOSIS — R202 Paresthesia of skin: Secondary | ICD-10-CM | POA: Diagnosis not present

## 2020-12-07 DIAGNOSIS — R0602 Shortness of breath: Secondary | ICD-10-CM | POA: Diagnosis not present

## 2020-12-07 DIAGNOSIS — N390 Urinary tract infection, site not specified: Secondary | ICD-10-CM | POA: Diagnosis not present

## 2020-12-07 DIAGNOSIS — I10 Essential (primary) hypertension: Secondary | ICD-10-CM | POA: Insufficient documentation

## 2020-12-07 DIAGNOSIS — R0789 Other chest pain: Secondary | ICD-10-CM | POA: Diagnosis present

## 2020-12-07 IMAGING — CR DG CHEST 2V
1 series · 2 of 2 positions shown · non-contrast
Comparison: None.

CLINICAL DATA: Chest pain

EXAM:
CHEST - 2 VIEW

[Series 1: dg chest 2 view · 0.14mm/px · 2 of 2 slices shown]
[im 1/2]
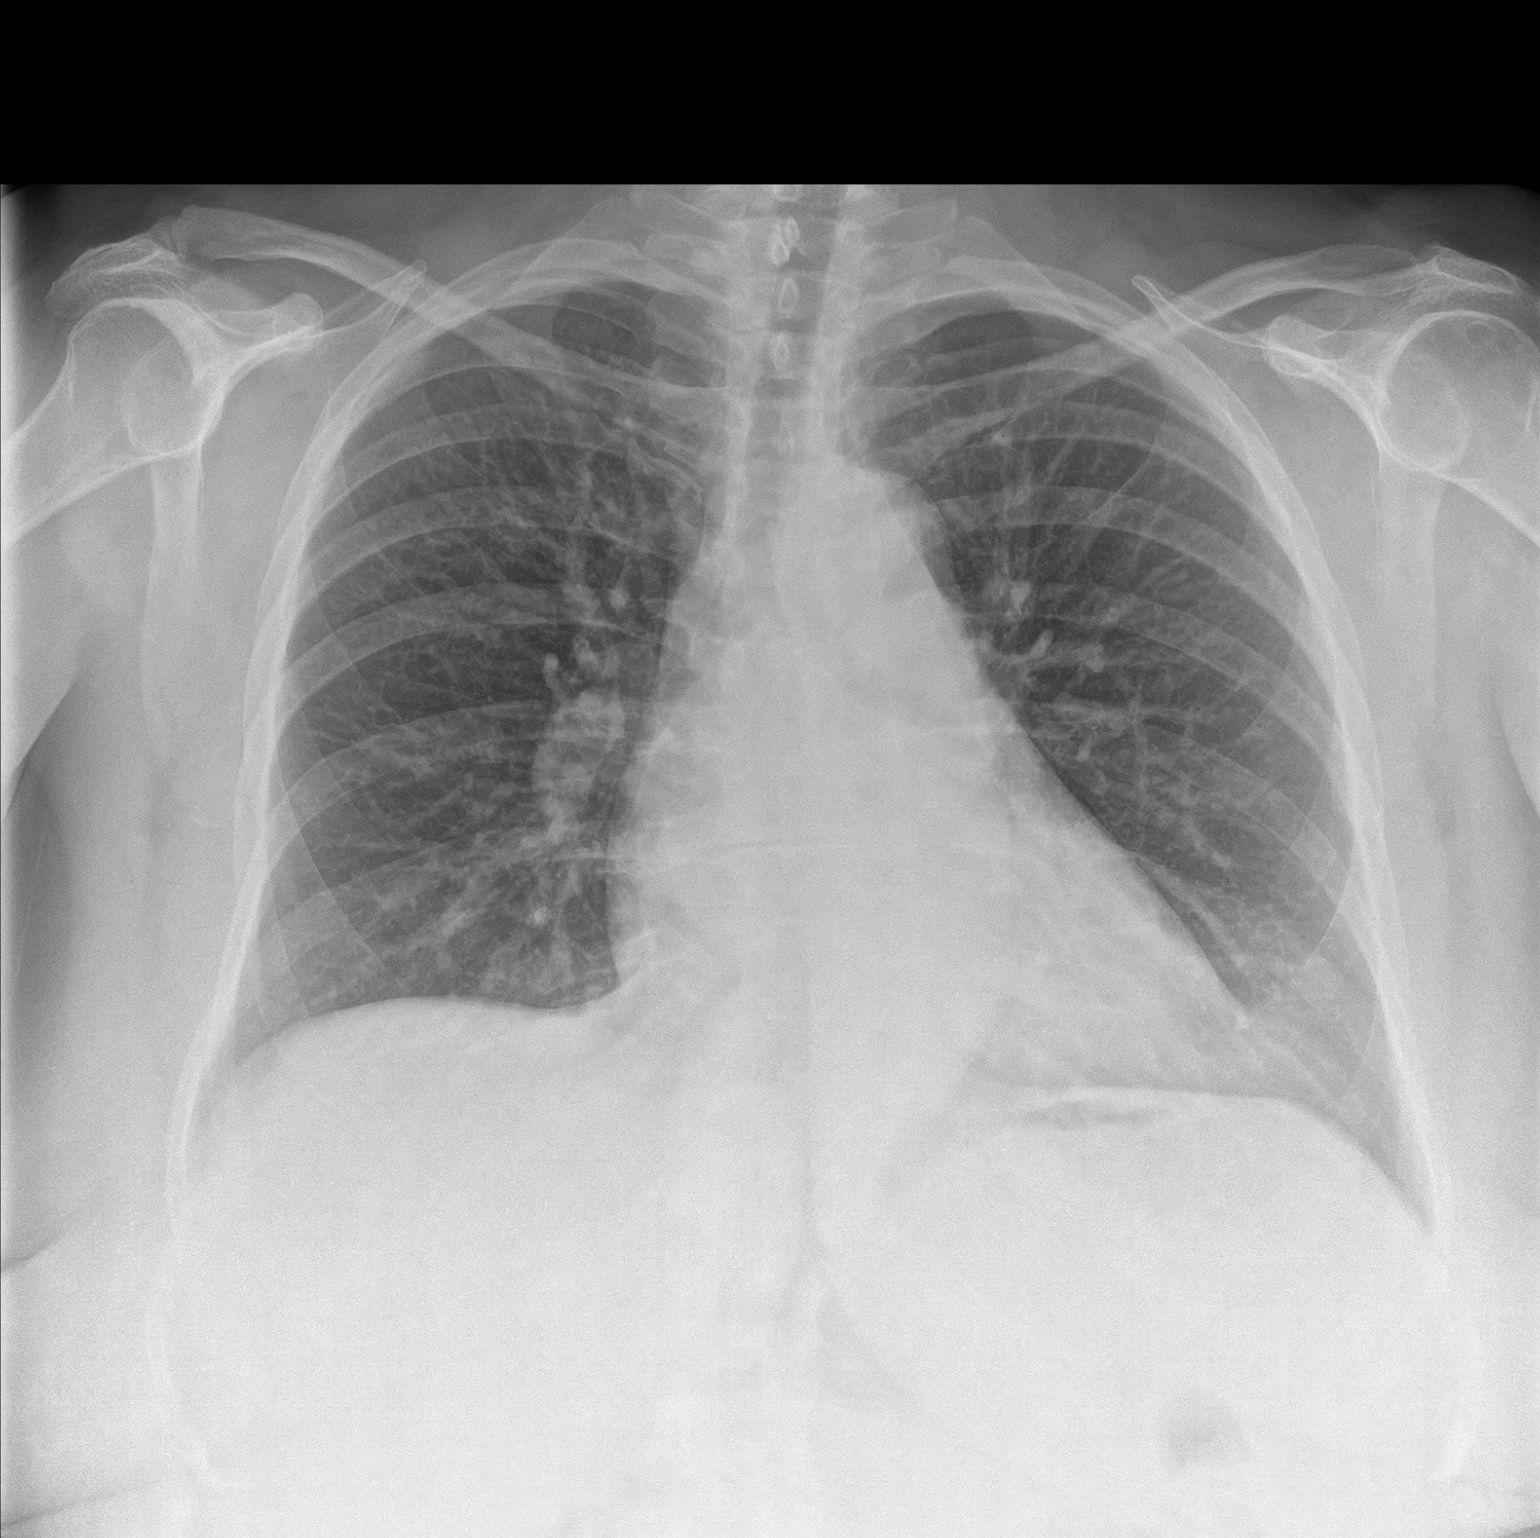
[im 2/2]
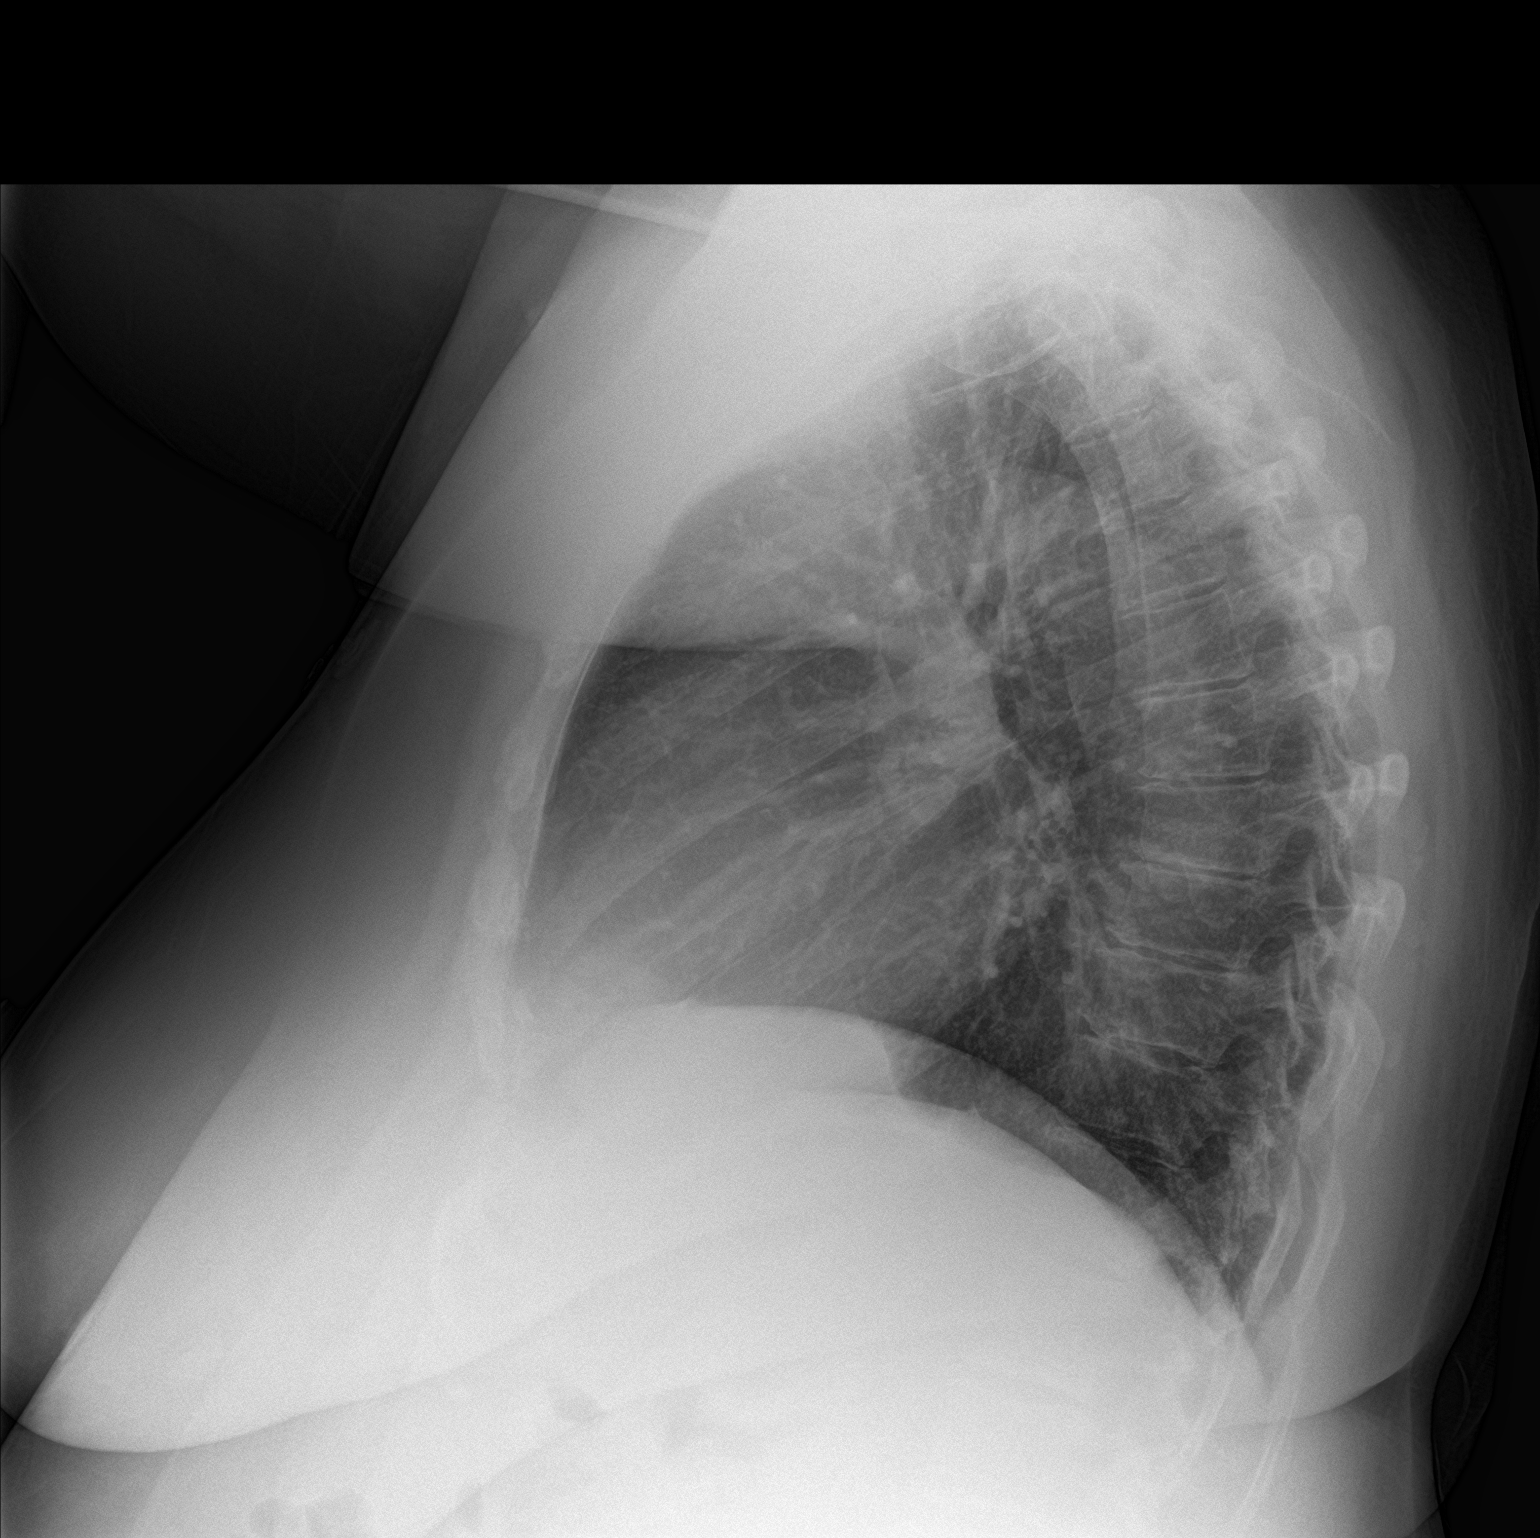

[2 of 2 positions shown; findings below may reference images not displayed]

FINDINGS: The heart size and mediastinal contours are within normal limits.
Both lungs are clear. The visualized skeletal structures are
unremarkable.
IMPRESSION: No active cardiopulmonary disease.

## 2020-12-07 NOTE — ED Triage Notes (Addendum)
PT endorsing chest pain. This new CP started march 2021. Pt endorsing episodes intermittent since then. Pt endorsing chest pain radiating into arms, pr endorsing "it feels like an out of body experience" PT endorsing times of aphasia at times during episodes. Pt endorsing that they have missed two doses out of the last 4 of their BP meds  coreg 12.5 bid. PT also endorsing that they might Have UTI because last time this happened pt had UTI. Also pr endorsing taking 150mg  approx of THC gummy today

## 2020-12-07 NOTE — ED Notes (Signed)
Blood drawn from straight stick and sent to lab, Urine obtained

## 2020-12-08 ENCOUNTER — Encounter: Payer: Self-pay | Admitting: Emergency Medicine

## 2020-12-08 ENCOUNTER — Emergency Department
Admission: EM | Admit: 2020-12-08 | Discharge: 2020-12-08 | Disposition: A | Discharge status: Home / Self Care | Payer: BLUE CROSS/BLUE SHIELD | Attending: Emergency Medicine | Admitting: Emergency Medicine

## 2020-12-08 DIAGNOSIS — R202 Paresthesia of skin: Secondary | ICD-10-CM

## 2020-12-08 DIAGNOSIS — R0789 Other chest pain: Secondary | ICD-10-CM

## 2020-12-08 DIAGNOSIS — N390 Urinary tract infection, site not specified: Secondary | ICD-10-CM

## 2020-12-08 DIAGNOSIS — I10 Essential (primary) hypertension: Secondary | ICD-10-CM

## 2020-12-08 HISTORY — DX: Depression, unspecified: F32.A

## 2020-12-08 HISTORY — DX: Essential (primary) hypertension: I10

## 2020-12-08 LAB — BASIC METABOLIC PANEL
Anion gap: 10 (ref 5–15)
BUN: 18 mg/dL (ref 6–20)
CO2: 25 mmol/L (ref 22–32)
Calcium: 9 mg/dL (ref 8.9–10.3)
Chloride: 104 mmol/L (ref 98–111)
Creatinine, Ser: 0.79 mg/dL (ref 0.44–1.00)
GFR, Estimated: >60 mL/min (ref >60)
Glucose, Bld: 139 mg/dL — ABNORMAL HIGH (ref 70–99)
Potassium: 3.9 mmol/L (ref 3.5–5.1)
Sodium: 139 mmol/L (ref 135–145)

## 2020-12-08 LAB — URINALYSIS, COMPLETE (UACMP) WITH MICROSCOPIC
Bilirubin Urine: NEGATIVE
Glucose, UA: NEGATIVE mg/dL
Hgb urine dipstick: NEGATIVE
Ketones, ur: NEGATIVE mg/dL
Nitrite: NEGATIVE
Protein, ur: NEGATIVE mg/dL
Specific Gravity, Urine: 1.025 (ref 1.005–1.030)
pH: 6 (ref 5.0–8.0)

## 2020-12-08 LAB — CBC WITH DIFFERENTIAL/PLATELET
Abs Immature Granulocytes: 0.03 10*3/uL (ref 0.00–0.07)
Basophils Absolute: 0.1 10*3/uL (ref 0.0–0.1)
Basophils Relative: 1 %
Eosinophils Absolute: 0.2 10*3/uL (ref 0.0–0.5)
Eosinophils Relative: 2 %
HCT: 41.1 % (ref 36.0–46.0)
Hemoglobin: 13.5 g/dL (ref 12.0–15.0)
Immature Granulocytes: 0 %
Lymphocytes Relative: 19 %
Lymphs Abs: 2.4 10*3/uL (ref 0.7–4.0)
MCH: 29.7 pg (ref 26.0–34.0)
MCHC: 32.8 g/dL (ref 30.0–36.0)
MCV: 90.5 fL (ref 80.0–100.0)
Monocytes Absolute: 0.9 10*3/uL (ref 0.1–1.0)
Monocytes Relative: 7 %
Neutro Abs: 9.3 10*3/uL — ABNORMAL HIGH (ref 1.7–7.7)
Neutrophils Relative %: 71 %
Platelets: 285 10*3/uL (ref 150–400)
RBC: 4.54 MIL/uL (ref 3.87–5.11)
RDW: 14.4 % (ref 11.5–15.5)
WBC: 12.9 10*3/uL — ABNORMAL HIGH (ref 4.0–10.5)
nRBC: 0 % (ref 0.0–0.2)

## 2020-12-08 LAB — TROPONIN I (HIGH SENSITIVITY)
Troponin I (High Sensitivity): 6 ng/L (ref <18)
Troponin I (High Sensitivity): 8 ng/L (ref <18)

## 2020-12-08 MED ORDER — CEPHALEXIN 500 MG PO CAPS
500.0000 mg | ORAL_CAPSULE | Freq: Once | ORAL | Status: AC
  Administered 2020-12-08: 500 mg via ORAL
  Filled 2020-12-08: qty 1

## 2020-12-08 MED ORDER — CEPHALEXIN 500 MG PO CAPS: 500.0000 mg | ORAL_CAPSULE | Freq: Two times a day (BID) | ORAL | 0 refills | Status: DC

## 2020-12-08 NOTE — ED Provider Notes (Signed)
[facility] Emergency Department Provider Note  ____________________________________________   Event Date/Time   First MD Initiated Contact with Patient 12/08/20 [phone]     (approximate)  I have reviewed the triage vital signs and the nursing notes.   HISTORY  Chief Complaint Chest Pain    HPI Samantha Phillips is a 60 y.o. female with h/o obesity, HTN, anxiety, type I Chiari malformation who presents to the emergency department with complaints of having intermittent "episodes" that have been ongoing since March 2021.  She states during these episodes she will become disoriented and starts to feel like her left arm is tingling and going numb and then it will radiate across her chest and go into the right arm and sometimes up into her head.  She states that she will have a squeezing pain in both of her arms and a squeezing pain in her chest.  States that she feels like she is going to pass out and feels like "everything is narrowing in".  No lightheadedness or vertigo.  No shortness of breath with these episodes but states she sometimes has shortness of breath preceding it.  No nausea, vomiting, diaphoresis.  States she has had multiple work-ups in [place] as well as here.  States the first time that this happened she was admitted to the hospital in [place].  She states she had a cardiac catheterization in May 2021 that was normal.  She also wore a Holter monitor for 4 weeks that did not show any cardiac events.  States she has also seen neurology in [place] and had MRIs of her brain in November 2021 and has also seen neurology here at [facility] clinic.  She was offered an EEG which she declined.  They recommended that she go to ophthalmology to rule out papilledema.  She states she saw the ophthalmologist and they told her everything was normal.  She was also seen by neurosurgery in [place] for her Chiari malformation.  She states when she has had these episodes happen before  they thought it could be due to hypertension.  She was hypertensive on arrival and states she missed her blood pressure medication over the past couple of days.  She states she is also had a UTI previously when she has had these episodes but has never had any urinary symptoms.  She states that with taking Keflex her symptoms have improved.  She states that her doctors also thought this could be anxiety, panic attacks and she states her primary care doctor did increase her Zoloft recently.  She states she is currently feeling better.  States these episodes usually last for several minutes at a time and come in waves.  The episode tonight lasted for about 3 hours intermittently.      Left Heart Cath 07/10/19:  Conclusion: 1.  No significant obstructive coronary disease 2.  Normal PA pressure 3.  Normal ejection fraction 4.  Mildly elevated LVEDP of 20 mmHg, mildly elevated wedge pressure of 18  mmHg. 5.  Normal cardiac output of 7.21 liter/minute, normal cardiac index of  3.35 liter/minute per meter sq.  Plan: 1.  Bedrest for 3 hr following this if groin site looks okay can ambulate 2.  Case discussed with patient and family 3.  Outpatient follow-up, rule out other causes of shortness of breath  like pulmonary causes, sleep apnea, deconditioning.   12/26/2019 MRI BRAIN WITHOUT/ MRA HEAD WITHOUT IMPRESSION:  MRI HEAD IMPRESSION:   1. No acute intracranial abnormality.  2. Borderline  Chiari 1 malformation with the cerebellar tonsils  extending approximately 6-7 mm through the foramen magnum.  3. Otherwise essentially normal brain MRI for age.   MRA HEAD IMPRESSION:   Negative intracranial MRA. No large vessel occlusion,  hemodynamically significant stenosis, or other vascular abnormality.   MR BRAIN WITH AND WITHOUT 06/16/2019 IMPRESSION:   Chiari I malformation without significant restriction of pulsatile CSF flow about the foramen magnum/cervicomedullary junction/cerebellar  tonsils.   MRI CERVICAL SPINE 06/16/2019 IMPRESSION:  1. Mild C6-C7 central canal stenosis secondary to a disc osteophyte complex with a right central protrusion.   2. Multilevel mild foraminal narrowing secondary to multilevel uncovertebral hypertrophy.   3. Findings compatible with a borderline Chiari I malformation.   4. Right thyroid lobe nodule measuring 1.9 cm. This nodule was seen on a 05/12/2018 thyroid ultrasound, however it appears to measure larger in size. Follow-up thyroid ultrasound is advised.   Hospitalized March 2021: During this hospitalization patient was worked up for a TIA. CT Head negative. MRI brain showed no acute infarct, Chiari 1 malformation seen. UA was positive for UTI, will be discharged home on Nitrofurantoin. Carotid duplex: right internal carotid artery: Less than 49% stenosis, left internal carotid artery: Less than 49% stenosis. Vertebral arteries are patent with antegrade flow bilaterally. Echocardiogram EF 60-65%, Grade 1 diastolic dysfunction.   Past Medical History:  Diagnosis Date   Depression    Hypertension     There are no problems to display for this patient.   History reviewed. No pertinent surgical history.  Prior to Admission medications   Medication Sig Start Date End Date Taking? Authorizing Provider  cephALEXin (KEFLEX) 500 MG capsule Take 1 capsule (500 mg total) by mouth 2 (two) times daily. 12/08/20  Yes Aayden Cefalu, Layla Maw, DO    Allergies Sulfa antibiotics and Penicillins  History reviewed. No pertinent family history.  Social History Social History   Tobacco Use   Smoking status: Never   Smokeless tobacco: Never  Vaping Use   Vaping Use: Never used  Substance Use Topics   Alcohol use: Not Currently   Drug use: Never    Review of Systems Constitutional: No fever. Eyes: No visual changes. ENT: No sore throat. Cardiovascular: Denies chest pain. Respiratory: Denies shortness of breath. Gastrointestinal: No nausea,  vomiting, diarrhea. Genitourinary: Negative for dysuria. Musculoskeletal: Negative for back pain. Skin: Negative for rash. Neurological: Negative for focal weakness or numbness.  ____________________________________________   PHYSICAL EXAM:  VITAL SIGNS: ED Triage Vitals  Enc Vitals Group     BP 12/07/20 2157 (!) 210/100     Pulse Rate 12/07/20 2157 97     Resp 12/07/20 2157 20     Temp 12/07/20 2157 99.1 F (37.3 C)     Temp src --      SpO2 12/07/20 2157 100 %     Weight 12/07/20 2153 270 lb (122.5 kg)     Height 12/07/20 2153 5\' 3"  (1.6 m)     Head Circumference --      Peak Flow --      Pain Score 12/07/20 2153 3     Pain Loc --      Pain Edu? --      Excl. in GC? --    CONSTITUTIONAL: Alert and oriented and responds appropriately to questions. Well-appearing; well-nourished HEAD: Normocephalic EYES: Conjunctivae clear, pupils appear equal, EOM appear intact ENT: normal nose; moist mucous membranes NECK: Supple, normal ROM CARD: RRR; S1 and S2 appreciated; no murmurs, no clicks,  no rubs, no gallops RESP: Normal chest excursion without splinting or tachypnea; breath sounds clear and equal bilaterally; no wheezes, no rhonchi, no rales, no hypoxia or respiratory distress, speaking full sentences ABD/GI: Normal bowel sounds; non-distended; soft, non-tender, no rebound, no guarding, no peritoneal signs, no hepatosplenomegaly BACK: The back appears normal EXT: Normal ROM in all joints; no deformity noted, no edema; no cyanosis SKIN: Normal color for age and race; warm; no rash on exposed skin NEURO: Moves all extremities equally, sensation to light touch intact diffusely, cranial nerves II through XII intact, normal speech, strength 5/5 in all 4 extremities PSYCH: The patient's mood and manner are appropriate.  ____________________________________________   LABS (all labs ordered are listed, but only abnormal results are displayed)  Labs Reviewed  CBC WITH  DIFFERENTIAL/PLATELET - Abnormal; Notable for the following components:      Result Value   WBC 12.9 (*)    Neutro Abs 9.3 (*)    All other components within normal limits  BASIC METABOLIC PANEL - Abnormal; Notable for the following components:   Glucose, Bld 139 (*)    All other components within normal limits  URINALYSIS, COMPLETE (UACMP) WITH MICROSCOPIC - Abnormal; Notable for the following components:   Color, Urine YELLOW (*)    APPearance HAZY (*)    Leukocytes,Ua SMALL (*)    Bacteria, UA RARE (*)    All other components within normal limits  URINE CULTURE  TROPONIN I (HIGH SENSITIVITY)  TROPONIN I (HIGH SENSITIVITY)   ____________________________________________  EKG   EKG Interpretation  Date/Time:  Saturday December 07 2020 21:46:05 EDT Ventricular Rate:  88 PR Interval:  168 QRS Duration: 88 QT Interval:  364 QTC Calculation: 440 R Axis:   70 Text Interpretation: Normal sinus rhythm No significant change since last tracing Confirmed by Rochele Raring [phone]) on 12/08/2020 2:19:29 AM        ____________________________________________  RADIOLOGY Normajean Baxter Dustan Hyams, personally viewed and evaluated these images (plain radiographs) as part of my medical decision making, as well as reviewing the written report by the radiologist.  ED MD interpretation: Chest x-ray shows no acute abnormality.  Official radiology report(s): DG Chest 2 View  Result Date: 12/07/2020 CLINICAL DATA:  Chest pain EXAM: CHEST - 2 VIEW COMPARISON:  None. FINDINGS: The heart size and mediastinal contours are within normal limits. Both lungs are clear. The visualized skeletal structures are unremarkable. IMPRESSION: No active cardiopulmonary disease. Electronically Signed   By: Charlett Nose M.D.   On: 12/07/2020 23:18    ____________________________________________   PROCEDURES  Procedure(s) performed (including Critical  Care):  Procedures  ____________________________________________   INITIAL IMPRESSION / ASSESSMENT AND PLAN / ED COURSE  As part of my medical decision making, I reviewed the following data within the electronic MEDICAL RECORD NUMBER Nursing notes reviewed and incorporated, Labs reviewed , EKG interpreted , Old EKG reviewed, Old chart reviewed, Radiograph reviewed , and Notes from prior ED visits         Patient here with episodes of intermittent chest discomfort, paresthesias have been ongoing intermittently since March 2021.  She has had significant outpatient work-up including cardiac catheterization, echocardiogram, Holter monitoring, MRIs of her brain.  She has seen her primary care doctor, cardiology in [place], neurology in [place] and [place], ophthalmology, neurosurgery.  She is asymptomatic currently.  Differential includes but not limited to ACS, PE, dissection, CVA, TIA, intracranial hemorrhage, electrolyte derangement, anxiety, seizures.  Work-up obtained from triage is so far unremarkable.  EKG nonischemic.  Troponin x2 negative.  Chest x-ray clear.  Low suspicion for PE, dissection.  Initially was quite hypertensive on arrival but this has improved on its own.  We did discuss that elevated blood pressure could cause some of the symptoms and recommend that she take all of her medications as prescribed and follow-up closely with cardiology as an outpatient.  Placed referral.  Patient also having numbness and tingling that is occurring in both arms.  Currently neurologically intact.  Has had MRIs of her brain which have been unremarkable other than an incidental Chiari I malformation which she has seen neurosurgery for and was told that there was nothing that needed to be done.  They did not think this was the cause of her symptoms.  She has seen Dr. Malvin Johns here at [facility] clinic.  They offered an EEG which she declined.  I have discussed with her that I do not think that she is  having a stroke especially given symptoms are bilateral.  I do not think this was a TIA.  I do not feel this was an intracranial hemorrhage and I do not feel CT imaging of her head would be beneficial especially given she has had normal MRIs.  She agrees with this plan and have encouraged her to follow-up with neurology.  She does state that when she has had these episodes before and she has had to go to the ER she was diagnosed with a UTI both times and is wondering if this could be contributing to her symptoms.  Her urine does appear like it could be an early infection today.  We will send urine culture and discharged on Keflex.  I do not think there is any life-threatening process present especially given she has had significant outpatient work-up that has been unrevealing and reassuring.  Patient is comfortable with plan for discharge home with follow-up with PCP, cardiology and neurology as an outpatient.  Discussed return precautions.  At this time, I do not feel there is any life-threatening condition present. I have reviewed, interpreted and discussed all results (EKG, imaging, lab, urine as appropriate) and exam findings with patient/family. I have reviewed nursing notes and appropriate previous records.  I feel the patient is safe to be discharged home without further emergent workup and can continue workup as an outpatient as needed. Discussed usual and customary return precautions. Patient/family verbalize understanding and are comfortable with this plan.  Outpatient follow-up has been provided as needed. All questions have been answered.  ____________________________________________   FINAL CLINICAL IMPRESSION(S) / ED DIAGNOSES  Final diagnoses:  Atypical chest pain  Acute UTI  Hypertension, unspecified type  Paresthesias     ED Discharge Orders          Ordered    Ambulatory referral to Cardiology        12/08/20 0230    cephALEXin (KEFLEX) 500 MG capsule  2 times daily         12/08/20 0314            *Please note:  Samantha Phillips was evaluated in Emergency Department on 12/08/2020 for the symptoms described in the history of present illness. She was evaluated in the context of the global COVID-19 pandemic, which necessitated consideration that the patient might be at risk for infection with the SARS-CoV-2 virus that causes COVID-19. Institutional protocols and algorithms that pertain to the evaluation of patients at risk for COVID-19 are in a state of rapid change based on information released by regulatory bodies  including the CDC and federal and state organizations. These policies and algorithms were followed during the patient's care in the ED.  Some ED evaluations and interventions may be delayed as a result of limited staffing during and the pandemic.*   Note:  This document was prepared using Dragon voice recognition software and may include unintentional dictation errors.    Neyra Pettie, Layla Maw, DO 12/08/20 0530

## 2020-12-08 NOTE — ED Notes (Signed)
Lab called re: results.  States the orders were not in the computer when they received the blood so they did not run it.  Will run labs now

## 2020-12-08 NOTE — ED Notes (Signed)
Lab called regarding blood work, states will run blood work at this time.

## 2020-12-09 LAB — URINE CULTURE

## 2020-12-16 ENCOUNTER — Other Ambulatory Visit: Payer: Self-pay

## 2020-12-16 ENCOUNTER — Ambulatory Visit: Payer: BLUE CROSS/BLUE SHIELD | Admitting: Cardiology

## 2020-12-16 ENCOUNTER — Encounter: Payer: Self-pay | Admitting: Cardiology

## 2020-12-16 VITALS — BP 160/110 | HR 67 | Ht 63.0 in | Wt 273.0 lb

## 2020-12-16 DIAGNOSIS — E7800 Pure hypercholesterolemia, unspecified: Secondary | ICD-10-CM

## 2020-12-16 DIAGNOSIS — I10 Essential (primary) hypertension: Secondary | ICD-10-CM

## 2020-12-16 DIAGNOSIS — R55 Syncope and collapse: Secondary | ICD-10-CM | POA: Diagnosis not present

## 2020-12-16 MED ORDER — LOSARTAN POTASSIUM 25 MG PO TABS: 25.0000 mg | ORAL_TABLET | Freq: Every day | ORAL | 5 refills | Status: DC

## 2020-12-16 NOTE — Patient Instructions (Signed)
Medication Instructions:   Your physician has recommended you make the following change in your medication:    START taking Losartan 25 MG once a day.  *If you need a refill on your cardiac medications before your next appointment, please call your pharmacy*   Lab Work: None ordered If you have labs (blood work) drawn today and your tests are completely normal, you will receive your results only by: MyChart Message (if you have MyChart) OR A paper copy in the mail If you have any lab test that is abnormal or we need to change your treatment, we will call you to review the results.   Testing/Procedures: None ordered   Follow-Up: At [facility], you and your health needs are our priority.  As part of our continuing mission to provide you with exceptional heart care, we have created designated Provider Care Teams.  These Care Teams include your primary Cardiologist (physician) and Advanced Practice Providers (APPs -  Physician Assistants and Nurse Practitioners) who all work together to provide you with the care you need, when you need it.  We recommend signing up for the patient portal called "MyChart".  Sign up information is provided on this After Visit Summary.  MyChart is used to connect with patients for Virtual Visits (Telemedicine).  Patients are able to view lab/test results, encounter notes, upcoming appointments, etc.  Non-urgent messages can be sent to your provider as well.   To learn more about what you can do with MyChart, go to [email or web address]://[email or web address].com.    Your next appointment:   3 month(s)  The format for your next appointment:   In Person  Provider:   You may see Dr. Agbor-Etang or one of the following Advanced Practice Providers on your designated Care Team:   Christopher Berge, NP Ryan Dunn, PA-C Jacquelyn Visser, PA-C Cadence Furth, PA-C   Other Instructions   

## 2020-12-16 NOTE — Progress Notes (Signed)
Cardiology Office Note:    Date:  12/16/2020   ID:  Samantha Phillips, DOB 08/26/1960, MRN [id number]  PCP:  Center, [facility] Providers Cardiologist:  None     Referring MD: Ward, Layla Maw, DO   Chief Complaint  Patient presents with   New Patient (Initial Visit)    [facility] ED follow up -- Meds reviewed verbally with patient.    Samantha Phillips is a 60 y.o. female who is being seen today for the evaluation of left arm pain, dizziness pain at the request of Ward, Layla Maw, DO.   History of Present Illness:    Samantha Phillips is a 60 y.o. female with a hx of hypertension, hyperlipidemia, obesity presenting with left arm pain.  Seen in the ED at [facility] 12/08/2020 due to chest pain.  Work-up including troponin was normal, EKG with no acute ischemia.  Blood pressures  were elevated at 210/100.  Also diagnosed with acute UTI, treated with antibiotics.  Patient last seen by cardiology on 06/2019 at [facility] in [place].  She complained of arm and chest discomfort at the time, underwent stress testing showing anterior ischemia.  Left heart cath did not show any significant obstruction or CAD.  Echo was normal.  She was also diagnosed with UTI at the time.  She states relatively okay over the subsequent months, moved to the area from [place].  Went to the ED due to recurrent left arm numbness, right arm pain, chest discomfort and dizziness.  Had UTI at the time.  She states her blood pressure is usually better at home with systolics in the 140s.  BP usually gets worse when she presents to a physician's office.  Current worsening chest pain.   Prior notes Left heart cath 06/2019 (outside study), no significant CAD.   Echo 04/2019 (outside study )EF 60 to 65%, grade 1 diastolic dysfunction.  Past Medical History:  Diagnosis Date   Depression    Hypertension     History reviewed. No pertinent surgical history.  Current Medications: Current Meds   Medication Sig   acetaminophen (TYLENOL) 500 MG tablet Take 500 mg by mouth as needed.   ascorbic acid (VITAMIN C) 500 MG tablet Take 500 mg by mouth daily.   atorvastatin (LIPITOR) 10 MG tablet Take 10 mg by mouth daily.   carvedilol (COREG) 12.5 MG tablet Take 12.5 mg by mouth 2 (two) times daily.   Cholecalciferol 25 MCG (1000 UT) tablet Take 1,000 Units by mouth daily.   ferrous sulfate 325 (65 FE) MG tablet Take 325 mg by mouth daily.   loratadine (CLARITIN) 10 MG tablet Take 10 mg by mouth daily.   losartan (COZAAR) 25 MG tablet Take 1 tablet (25 mg total) by mouth daily.   sertraline (ZOLOFT) 50 MG tablet Take 75 mg by mouth daily.     Allergies:   Sulfa antibiotics and Penicillins   Social History   Socioeconomic History   Marital status: Divorced    Spouse name: Not on file   Number of children: Not on file   Years of education: Not on file   Highest education level: Not on file  Occupational History   Not on file  Tobacco Use   Smoking status: Never   Smokeless tobacco: Never  Vaping Use   Vaping Use: Never used  Substance and Sexual Activity   Alcohol use: Not Currently   Drug use: Never   Sexual activity: Not on file  Other  Topics Concern   Not on file  Social History Narrative   Not on file   Social Determinants of Health   Financial Resource Strain: Not on file  Food Insecurity: Not on file  Transportation Needs: Not on file  Physical Activity: Not on file  Stress: Not on file  Social Connections: Not on file     Family History: The patient's family history is not on file.  ROS:   Please see the history of present illness.     All other systems reviewed and are negative.  EKGs/Labs/Other Studies Reviewed:    The following studies were reviewed today:   EKG:  EKG is  ordered today.  The ekg ordered today demonstrates normal sinus rhythm, normal ECG  Recent Labs: 12/25/2019: ALT 18 12/07/2020: BUN 18; Creatinine, Ser 0.79; Hemoglobin 13.5;  Platelets 285; Potassium 3.9; Sodium 139  Recent Lipid Panel No results found for: CHOL, TRIG, HDL, CHOLHDL, VLDL, LDLCALC, LDLDIRECT   Risk Assessment/Calculations:         Physical Exam:    VS:  BP (!) 160/110 (BP Location: Left Arm, Patient Position: Sitting, Cuff Size: Large)   Pulse 67   Ht 5\' 3"  (1.6 m)   Wt 273 lb (123.8 kg)   SpO2 97%   BMI 48.36 kg/m     Wt Readings from Last 3 Encounters:  12/16/20 273 lb (123.8 kg)  12/07/20 270 lb (122.5 kg)  12/25/19 257 lb (116.6 kg)     GEN:  Well nourished, well developed in no acute distress HEENT: Normal NECK: No JVD; No carotid bruits LYMPHATICS: No lymphadenopathy CARDIAC: RRR, no murmurs, rubs, gallops RESPIRATORY:  Clear to auscultation without rales, wheezing or rhonchi  ABDOMEN: Soft, non-tender, non-distended MUSCULOSKELETAL:  No edema; No deformity  SKIN: Warm and dry NEUROLOGIC:  Alert and oriented x 3 PSYCHIATRIC:  Normal affect   ASSESSMENT:    1. Pre-syncope   2. Primary hypertension   3. Pure hypercholesterolemia   4. Morbid obesity (HCC)    PLAN:    In order of problems listed above:  Patient with symptoms of arm numbness, tingling, dizziness with no passing out, symptoms appear to be vasovagal in etiology, last 2 occurrences associated with UTI.  Unsure if UTI was contributing.  Prior cardiac work-up with echo and left heart cath did not show any cardiac etiologies.  Safety precautions when symptoms or call advised. Hypertension, BP elevated.  Continue Coreg, start losartan 25 mg daily. Hyperlipidemia, continue Lipitor. Morbid obesity, low-calorie diet, weight loss advised.  Follow-up in 3 months.     Medication Adjustments/Labs and Tests Ordered: Current medicines are reviewed at length with the patient today.  Concerns regarding medicines are outlined above.  Orders Placed This Encounter  Procedures   EKG 12-Lead    Meds ordered this encounter  Medications   losartan (COZAAR) 25 MG  tablet    Sig: Take 1 tablet (25 mg total) by mouth daily.    Dispense:  30 tablet    Refill:  5     Patient Instructions  Medication Instructions:   Your physician has recommended you make the following change in your medication:    START taking Losartan 25 MG once a day.  *If you need a refill on your cardiac medications before your next appointment, please call your pharmacy*   Lab Work: None ordered If you have labs (blood work) drawn today and your tests are completely normal, you will receive your results only by: MyChart Message (if  you have MyChart) OR A paper copy in the mail If you have any lab test that is abnormal or we need to change your treatment, we will call you to review the results.   Testing/Procedures: None ordered   Follow-Up: At [facility], you and your health needs are our priority.  As part of our continuing mission to provide you with exceptional heart care, we have created designated Provider Care Teams.  These Care Teams include your primary Cardiologist (physician) and Advanced Practice Providers (APPs -  Physician Assistants and Nurse Practitioners) who all work together to provide you with the care you need, when you need it.  We recommend signing up for the patient portal called "MyChart".  Sign up information is provided on this After Visit Summary.  MyChart is used to connect with patients for Virtual Visits (Telemedicine).  Patients are able to view lab/test results, encounter notes, upcoming appointments, etc.  Non-urgent messages can be sent to your provider as well.   To learn more about what you can do with MyChart, go to [email or web address].    Your next appointment:   3 month(s)  The format for your next appointment:   In Person  Provider:   You may see Dr. Azucena Cecil or one of the following Advanced Practice Providers on your designated Care Team:   Nicolasa Ducking, NP Eula Listen, PA-C Marisue Ivan, PA-C Cadence  [place], [place]   Other Instructions     Signed, Debbe Odea, MD  12/16/2020 1:25 PM    [facility]

## 2021-01-20 ENCOUNTER — Other Ambulatory Visit: Payer: Self-pay | Admitting: Family Medicine

## 2021-01-20 DIAGNOSIS — N6310 Unspecified lump in the right breast, unspecified quadrant: Secondary | ICD-10-CM

## 2021-02-10 ENCOUNTER — Inpatient Hospital Stay: Admission: RE | Admit: 2021-02-10 | Payer: BLUE CROSS/BLUE SHIELD | Source: Ambulatory Visit

## 2021-02-10 ENCOUNTER — Other Ambulatory Visit: Payer: BLUE CROSS/BLUE SHIELD

## 2021-02-25 ENCOUNTER — Other Ambulatory Visit: Payer: Self-pay

## 2021-02-25 ENCOUNTER — Ambulatory Visit: Admission: RE | Admit: 2021-02-25 | Payer: BLUE CROSS/BLUE SHIELD | Source: Ambulatory Visit

## 2021-02-28 ENCOUNTER — Ambulatory Visit
Admission: RE | Admit: 2021-02-28 | Discharge: 2021-02-28 | Disposition: A | Discharge status: Home / Self Care | Payer: BLUE CROSS/BLUE SHIELD | Source: Ambulatory Visit | Attending: Family Medicine | Admitting: Family Medicine

## 2021-02-28 ENCOUNTER — Other Ambulatory Visit: Payer: Self-pay

## 2021-02-28 DIAGNOSIS — N6310 Unspecified lump in the right breast, unspecified quadrant: Secondary | ICD-10-CM | POA: Insufficient documentation

## 2021-02-28 IMAGING — MG DIGITAL DIAGNOSTIC BILAT W/ TOMO W/ CAD
8 of 14 series · 8 of 40 positions shown · non-contrast
Comparison: Previous exam(s).

CLINICAL DATA: Follow-up of a right breast mass.

EXAM:
DIGITAL DIAGNOSTIC BILATERAL MAMMOGRAM WITH TOMOSYNTHESIS AND CAD;
ULTRASOUND RIGHT BREAST LIMITED
TECHNIQUE: Bilateral digital diagnostic mammography and breast tomosynthesis
was performed. The images were evaluated with computer-aided
detection.; Targeted ultrasound examination of the right breast was
performed

[L MLO synth-2D]
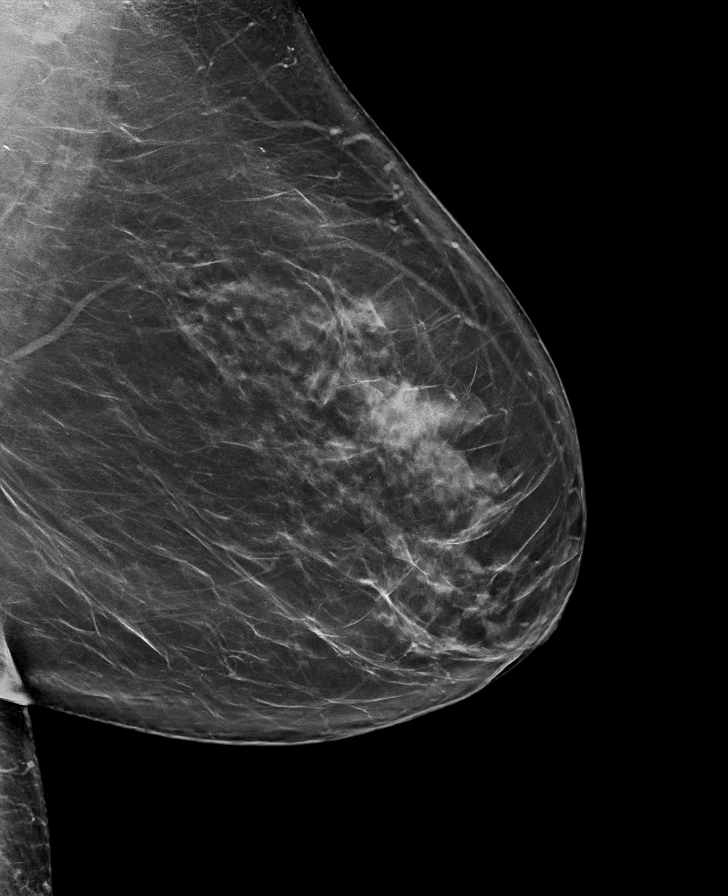

[R CC synth-2D (1 of 3)]
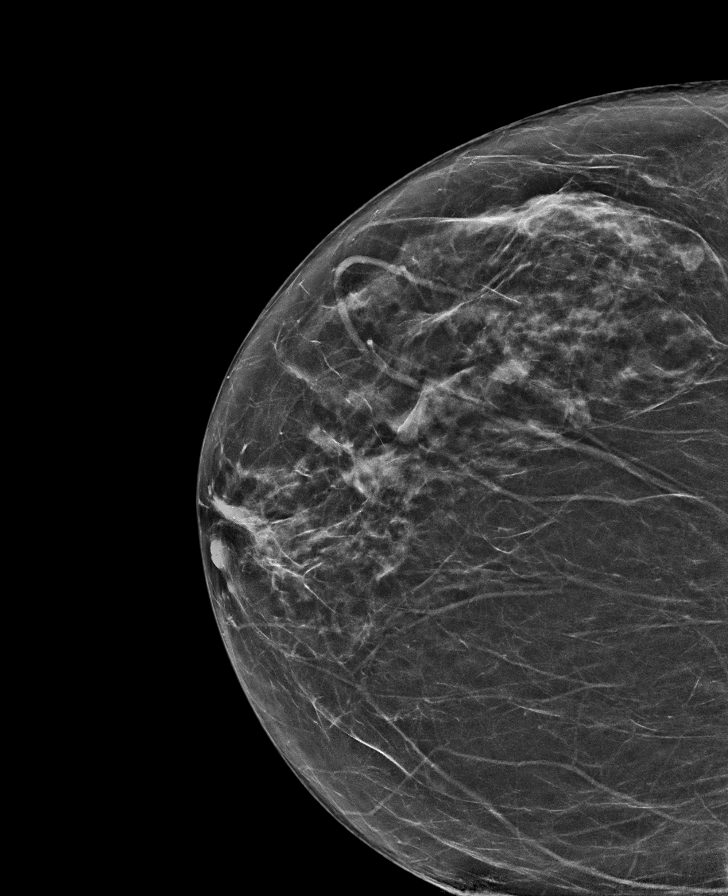

[R MLO synth-2D]
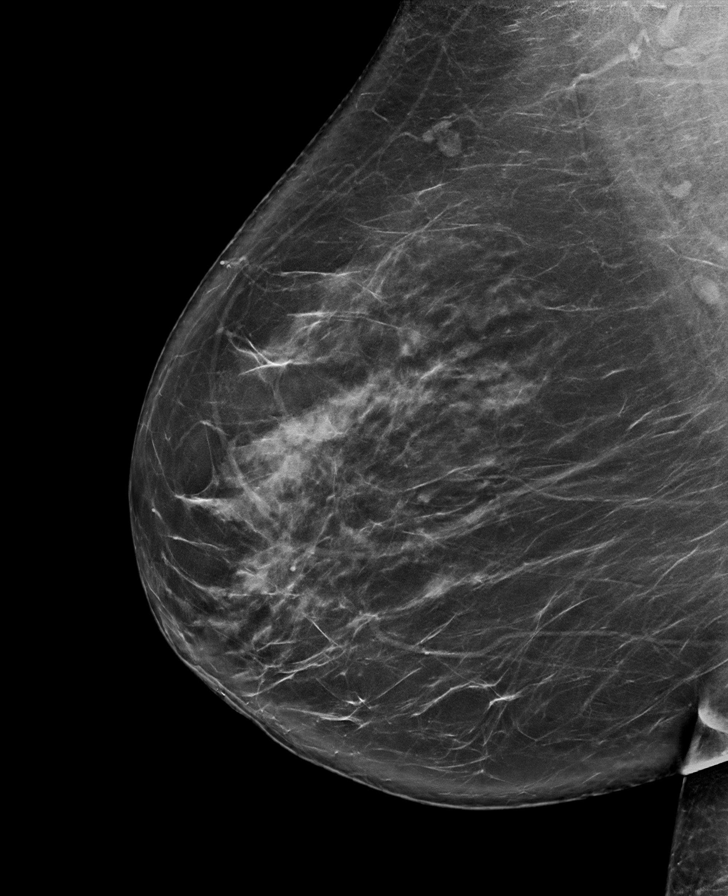

[L CC synth-2D]
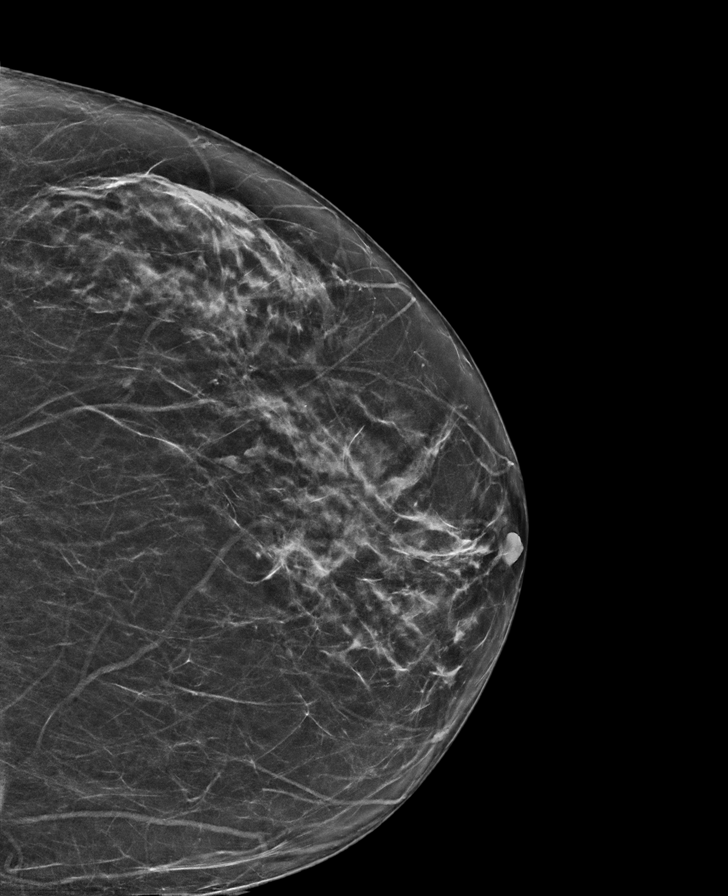

[R CC synth-2D (2 of 3)]
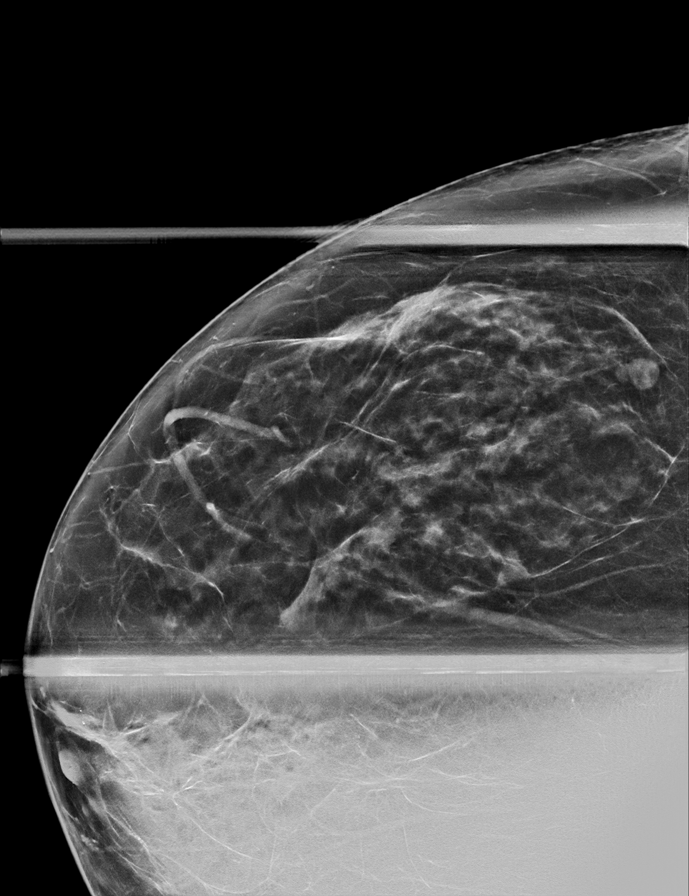

[R CV synth-2D]
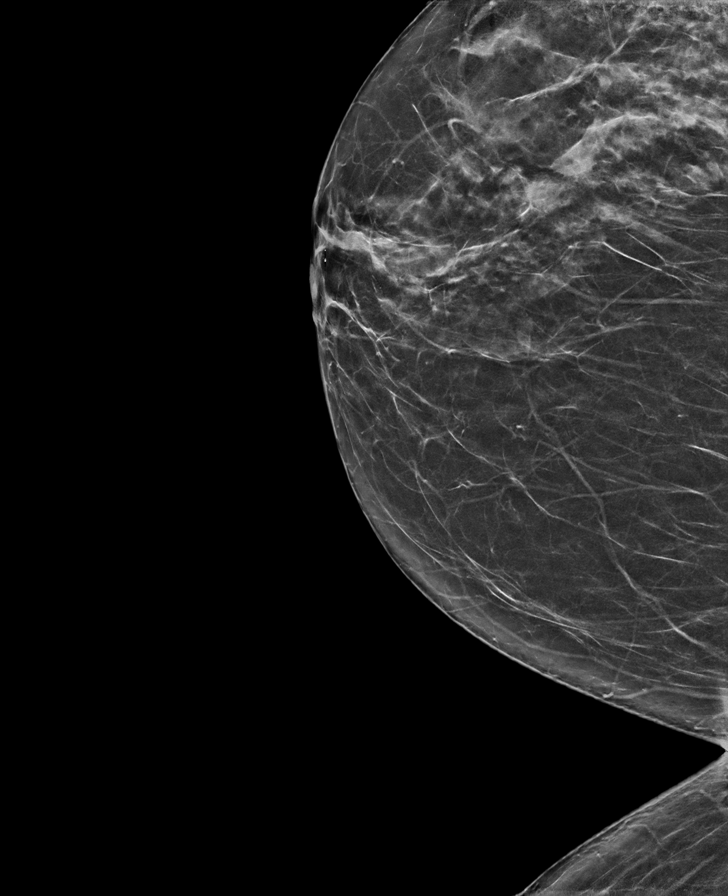

[R CC synth-2D (3 of 3)]
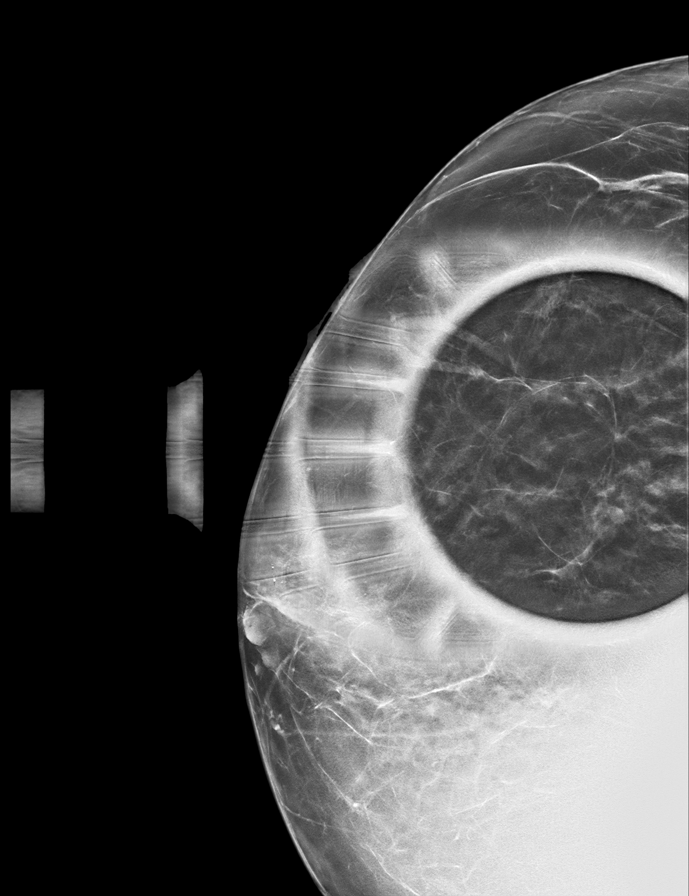

[R CV tomo · tomo slice 35/70.0]
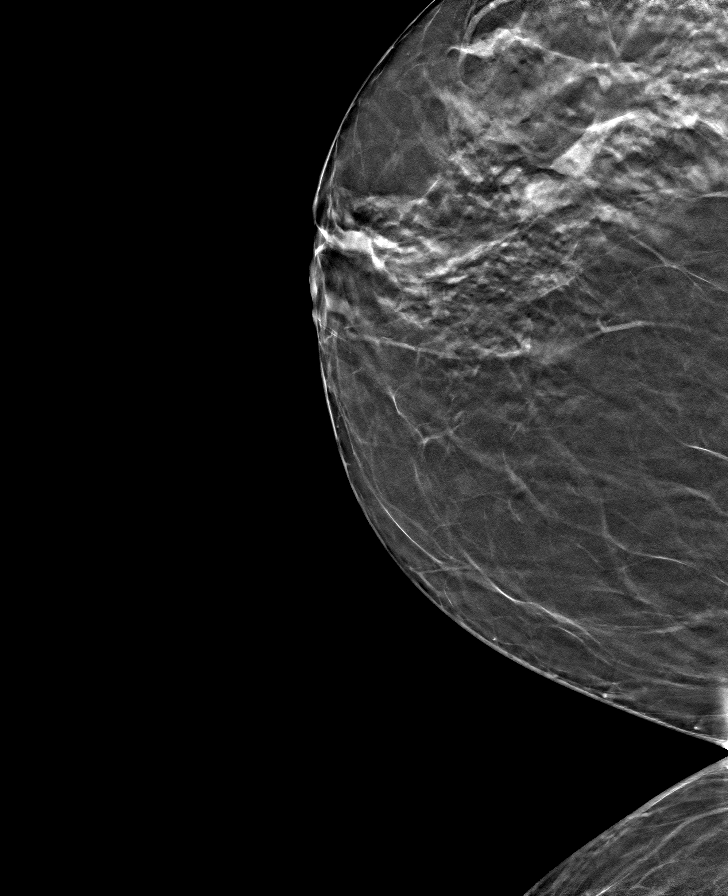

[8 of 40 positions shown; findings below may reference images not displayed]

ACR Breast Density Category b: There are scattered areas of
fibroglandular density.
FINDINGS: The mass in the lower outer right breast is stable mammographically.
No other suspicious findings identified in either breast.

On physical exam, no suspicious lumps are identified.

Targeted ultrasound is performed, showing no significant change in
the right breast mass at 8 o'clock, 6 cm from the nipple. The mass
measures 12.7 x 7 x 4 mm today, not significantly changed.
IMPRESSION: No change in the right breast mass, now considered benign. No
mammographic evidence of malignancy.

RECOMMENDATION:
Annual screening mammography.

I have discussed the findings and recommendations with the patient.
If applicable, a reminder letter will be sent to the patient
regarding the next appointment.

BI-RADS CATEGORY  2: Benign.

## 2021-02-28 IMAGING — US US BREAST*R* LIMITED INC AXILLA
1 series · 5 of 5 positions shown · non-contrast
Comparison: Previous exam(s).

CLINICAL DATA: Follow-up of a right breast mass.

EXAM:
DIGITAL DIAGNOSTIC BILATERAL MAMMOGRAM WITH TOMOSYNTHESIS AND CAD;
ULTRASOUND RIGHT BREAST LIMITED
TECHNIQUE: Bilateral digital diagnostic mammography and breast tomosynthesis
was performed. The images were evaluated with computer-aided
detection.; Targeted ultrasound examination of the right breast was
performed

[Series 1: us breast*right* limited inc axilla · 0.04mm/px · 5 of 5 slices shown]
[im 1/5]
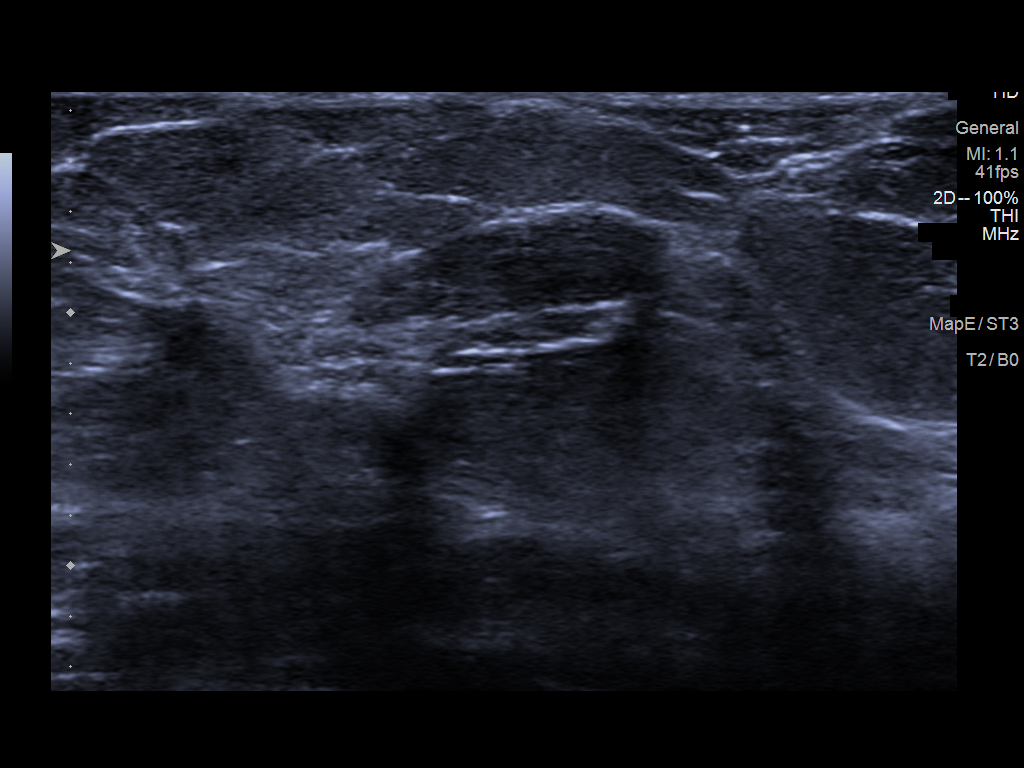
[im 2/5]
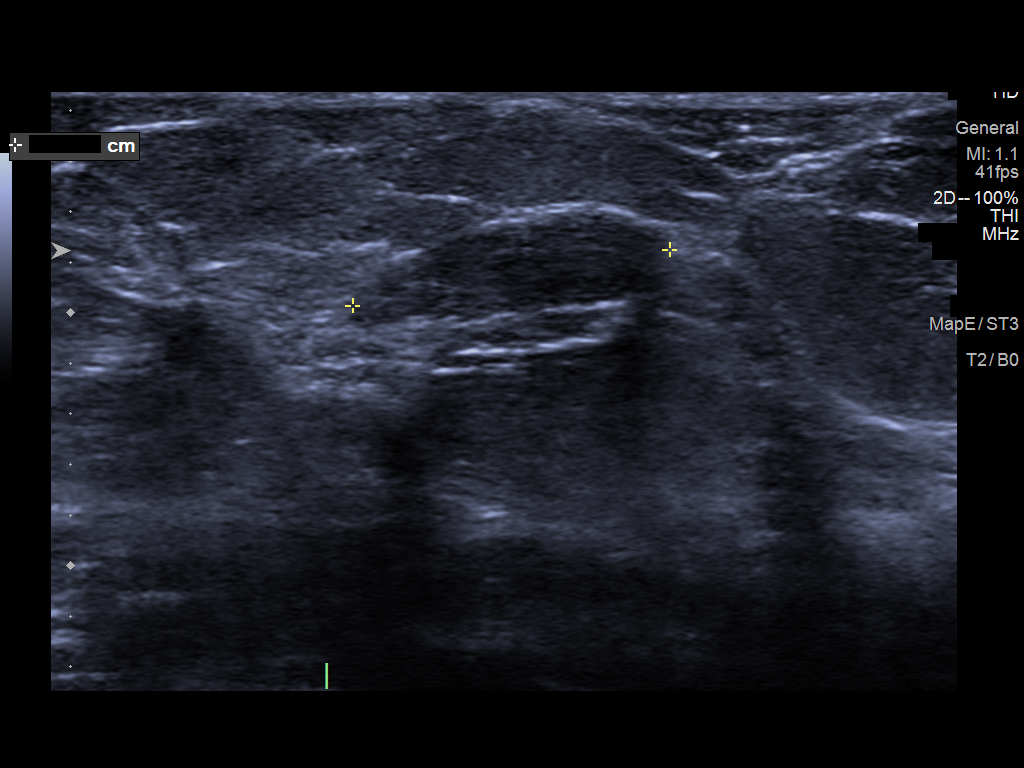
[im 3/5]
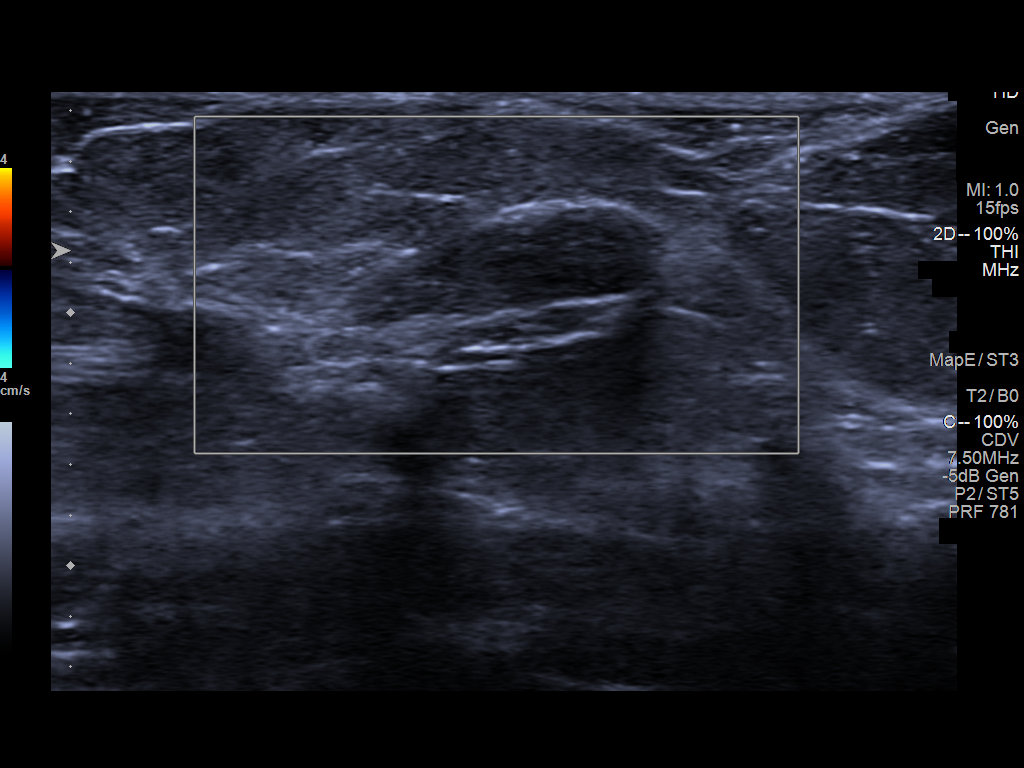
[im 4/5]
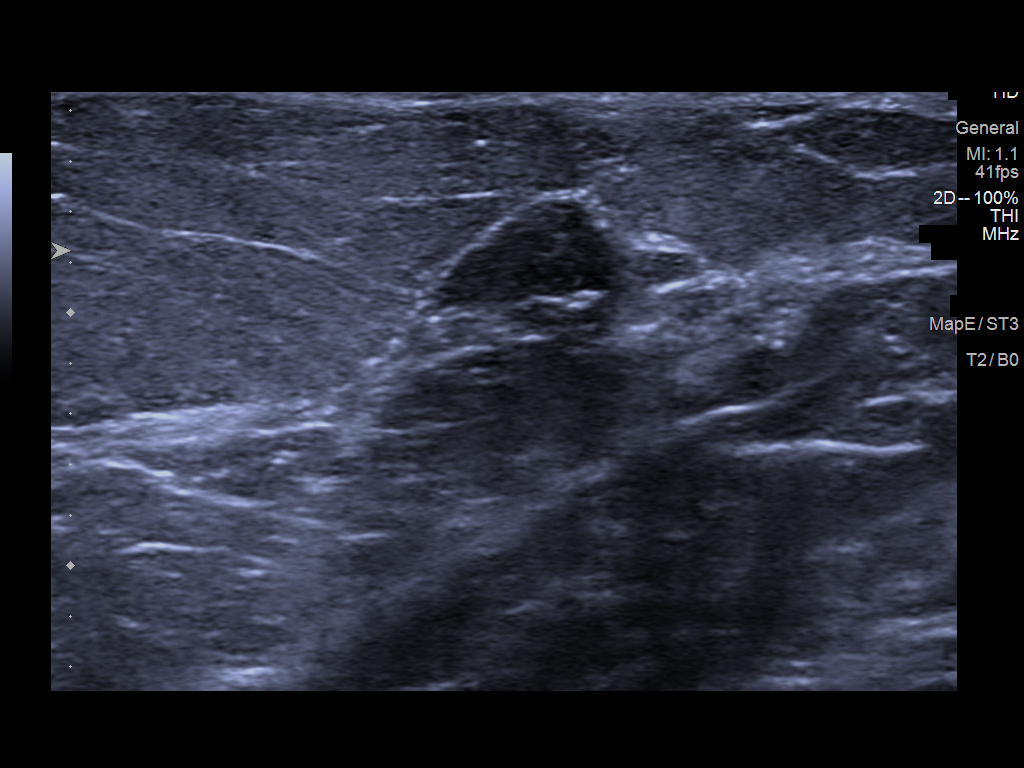
[im 5/5]
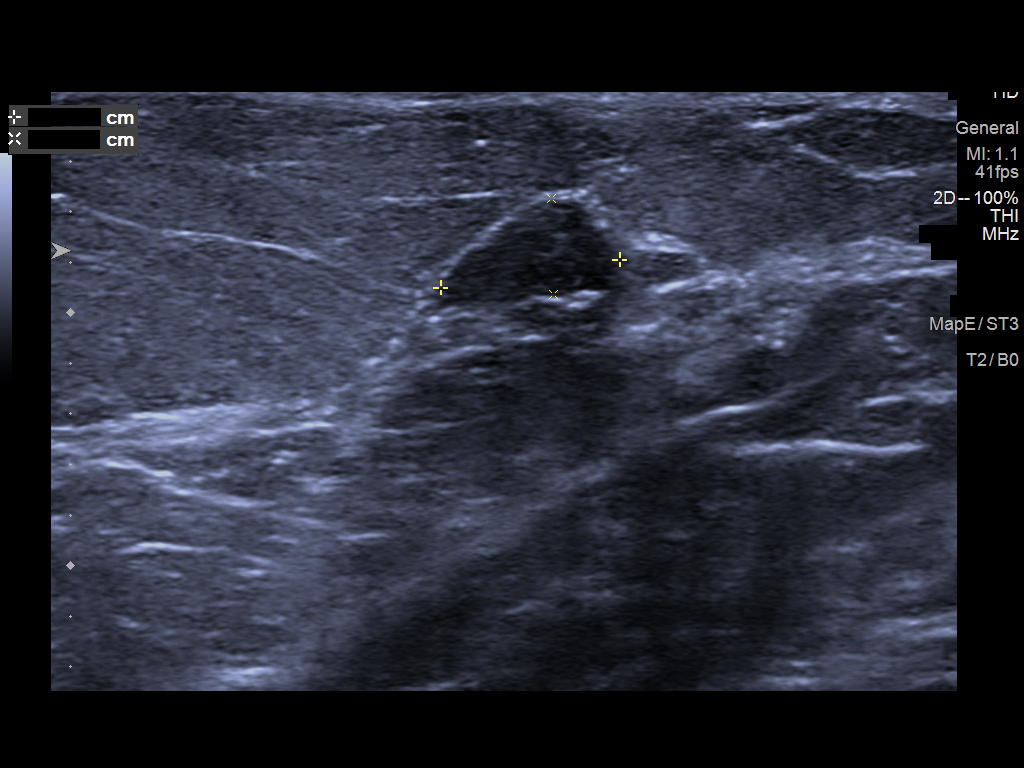

[5 of 5 positions shown; findings below may reference images not displayed]

ACR Breast Density Category b: There are scattered areas of
fibroglandular density.
FINDINGS: The mass in the lower outer right breast is stable mammographically.
No other suspicious findings identified in either breast.

On physical exam, no suspicious lumps are identified.

Targeted ultrasound is performed, showing no significant change in
the right breast mass at 8 o'clock, 6 cm from the nipple. The mass
measures 12.7 x 7 x 4 mm today, not significantly changed.
IMPRESSION: No change in the right breast mass, now considered benign. No
mammographic evidence of malignancy.

RECOMMENDATION:
Annual screening mammography.

I have discussed the findings and recommendations with the patient.
If applicable, a reminder letter will be sent to the patient
regarding the next appointment.

BI-RADS CATEGORY  2: Benign.

## 2021-03-20 ENCOUNTER — Ambulatory Visit: Payer: BLUE CROSS/BLUE SHIELD | Admitting: Cardiology

## 2021-04-17 ENCOUNTER — Other Ambulatory Visit: Payer: Self-pay

## 2021-04-17 ENCOUNTER — Encounter: Payer: Self-pay | Admitting: Cardiology

## 2021-04-17 ENCOUNTER — Ambulatory Visit: Payer: BLUE CROSS/BLUE SHIELD | Admitting: Cardiology

## 2021-04-17 VITALS — BP 132/80 | HR 67 | Ht 63.0 in | Wt 240.0 lb

## 2021-04-17 DIAGNOSIS — I10 Essential (primary) hypertension: Secondary | ICD-10-CM

## 2021-04-17 DIAGNOSIS — E7800 Pure hypercholesterolemia, unspecified: Secondary | ICD-10-CM

## 2021-04-17 NOTE — Patient Instructions (Signed)

## 2021-04-17 NOTE — Progress Notes (Signed)
Cardiology Office Note:    Date:  04/17/2021   ID:  Samantha Phillips, DOB 07-01-1960, MRN [id number]  PCP:  Center, [facility] Providers Cardiologist:  None     Referring MD: Center, [facility]*   Chief Complaint  Patient presents with   Other    3 month follow up -- Meds reviewed verbally with patient.     History of Present Illness:    Samantha Phillips is a 61 y.o. female with a hx of hypertension, hyperlipidemia, obesity presenting for follow-up.  Previously seen due to elevated blood pressures and presyncope .    Cardiac work-up for presyncope was unrevealing, associated with UTI.  BP was elevated at last visit, started on losartan 25 mg daily.  Denies any adverse effects with current medications.  Also takes Coreg 12.5 mg twice daily.  Her blood pressures have been adequately controlled on current BP meds.  She feels well, has no other concerns at this time.   Prior notes Left heart cath 06/2019 (outside study), no significant CAD.   Echo 04/2019 (outside study )EF 60 to 65%, grade 1 diastolic dysfunction.  Seen in the ED at [facility] 12/08/2020 due to chest pain.  Work-up including troponin was normal, EKG with no acute ischemia.  Blood pressures  were elevated at 210/100.  Also diagnosed with acute UTI, treated with antibiotics.  Patient last seen by cardiology on 06/2019 at [facility] in [place].  She complained of arm and chest discomfort at the time, underwent stress testing showing anterior ischemia.  Left heart cath did not show any significant obstruction or CAD.  Echo was normal.  She was also diagnosed with UTI at the time.  She states relatively okay over the subsequent months, moved to the area from [place].  Went to the ED due to recurrent left arm numbness, right arm pain, chest discomfort and dizziness.  Had UTI at the time.  Past Medical History:  Diagnosis Date   Depression    Hypertension     History reviewed. No  pertinent surgical history.  Current Medications: Current Meds  Medication Sig   acetaminophen (TYLENOL) 500 MG tablet Take 500 mg by mouth as needed.   ascorbic acid (VITAMIN C) 500 MG tablet Take 500 mg by mouth daily.   atorvastatin (LIPITOR) 10 MG tablet Take 10 mg by mouth daily.   carvedilol (COREG) 12.5 MG tablet Take 12.5 mg by mouth 2 (two) times daily.   Cholecalciferol 25 MCG (1000 UT) tablet Take 1,000 Units by mouth daily.   ferrous sulfate 325 (65 FE) MG tablet Take 325 mg by mouth daily.   losartan (COZAAR) 25 MG tablet Take 1 tablet (25 mg total) by mouth daily.   sertraline (ZOLOFT) 50 MG tablet Take 75 mg by mouth daily.     Allergies:   Sulfa antibiotics and Penicillins   Social History   Socioeconomic History   Marital status: Divorced    Spouse name: Not on file   Number of children: Not on file   Years of education: Not on file   Highest education level: Not on file  Occupational History   Not on file  Tobacco Use   Smoking status: Never   Smokeless tobacco: Never  Vaping Use   Vaping Use: Never used  Substance and Sexual Activity   Alcohol use: Not Currently   Drug use: Never   Sexual activity: Not on file  Other Topics Concern   Not on file  Social History Narrative   Not on file   Social Determinants of Health   Financial Resource Strain: Not on file  Food Insecurity: Not on file  Transportation Needs: Not on file  Physical Activity: Not on file  Stress: Not on file  Social Connections: Not on file     Family History: The patient's family history includes Breast cancer in her maternal grandmother.  ROS:   Please see the history of present illness.     All other systems reviewed and are negative.  EKGs/Labs/Other Studies Reviewed:    The following studies were reviewed today:   EKG:  EKG not ordered today.    Recent Labs: 12/07/2020: BUN 18; Creatinine, Ser 0.79; Hemoglobin 13.5; Platelets 285; Potassium 3.9; Sodium 139   Recent Lipid Panel No results found for: CHOL, TRIG, HDL, CHOLHDL, VLDL, LDLCALC, LDLDIRECT   Risk Assessment/Calculations:         Physical Exam:    VS:  BP 132/80 (BP Location: Right Arm, Patient Position: Sitting, Cuff Size: Large)    Pulse 67    Ht 5\' 3"  (1.6 m)    Wt 240 lb (108.9 kg)    SpO2 97%    BMI 42.51 kg/m     Wt Readings from Last 3 Encounters:  04/17/21 240 lb (108.9 kg)  12/16/20 273 lb (123.8 kg)  12/07/20 270 lb (122.5 kg)     GEN:  Well nourished, well developed in no acute distress HEENT: Normal NECK: No JVD; No carotid bruits LYMPHATICS: No lymphadenopathy CARDIAC: RRR, no murmurs, rubs, gallops RESPIRATORY:  Clear to auscultation without rales, wheezing or rhonchi  ABDOMEN: Soft, non-tender, non-distended MUSCULOSKELETAL:  No edema; No deformity  SKIN: Warm and dry NEUROLOGIC:  Alert and oriented x 3 PSYCHIATRIC:  Normal affect   ASSESSMENT:    1. Primary hypertension   2. Pure hypercholesterolemia   3. Morbid obesity (HCC)     PLAN:    In order of problems listed above:  Hypertension, BP controlled, continue Coreg, losartan 25 mg daily. Hyperlipidemia, continue Lipitor. Morbid obesity, low-calorie diet, weight loss advised.  Follow-up as needed    Medication Adjustments/Labs and Tests Ordered: Current medicines are reviewed at length with the patient today.  Concerns regarding medicines are outlined above.  No orders of the defined types were placed in this encounter.   No orders of the defined types were placed in this encounter.    Patient Instructions  Medication Instructions:  Your physician recommends that you continue on your current medications as directed. Please refer to the Current Medication list given to you today.  *If you need a refill on your cardiac medications before your next appointment, please call your pharmacy*   Lab Work: None ordered If you have labs (blood work) drawn today and your tests are  completely normal, you will receive your results only by: MyChart Message (if you have MyChart) OR A paper copy in the mail If you have any lab test that is abnormal or we need to change your treatment, we will call you to review the results.   Testing/Procedures: None ordered   Follow-Up: At [facility], you and your health needs are our priority.  As part of our continuing mission to provide you with exceptional heart care, we have created designated Provider Care Teams.  These Care Teams include your primary Cardiologist (physician) and Advanced Practice Providers (APPs -  Physician Assistants and Nurse Practitioners) who all work together to provide you with the care you  need, when you need it.  We recommend signing up for the patient portal called "MyChart".  Sign up information is provided on this After Visit Summary.  MyChart is used to connect with patients for Virtual Visits (Telemedicine).  Patients are able to view lab/test results, encounter notes, upcoming appointments, etc.  Non-urgent messages can be sent to your provider as well.   To learn more about what you can do with MyChart, go to [email or web address].    Your next appointment:   Follow up as needed   The format for your next appointment:   In Person  Provider:   You may see Debbe Odea, MD or one of the following Advanced Practice Providers on your designated Care Team:   Nicolasa Ducking, NP Eula Listen, PA-C [place]ence [place], [place]    Other Instructions     Signed, Debbe Odea, MD  04/17/2021 10:52 AM    [facility]

## 2021-06-10 ENCOUNTER — Other Ambulatory Visit: Payer: Self-pay | Admitting: *Deleted

## 2021-06-10 MED ORDER — LOSARTAN POTASSIUM 25 MG PO TABS: 25.0000 mg | ORAL_TABLET | Freq: Every day | ORAL | 3 refills | Status: DC

## 2021-09-10 ENCOUNTER — Other Ambulatory Visit: Payer: Self-pay

## 2021-09-10 MED ORDER — LOSARTAN POTASSIUM 25 MG PO TABS: 25.0000 mg | ORAL_TABLET | Freq: Every day | ORAL | 2 refills | Status: AC
